# Patient Record
Sex: Male | Born: 1955 | Race: Black or African American | Hispanic: No | Marital: Single | Smoking: Current every day smoker
Health system: Southern US, Community
[De-identification: ages and names within clinical notes are randomized; demographics above are authoritative.]

## PROBLEM LIST (undated history)

## (undated) DIAGNOSIS — C801 Malignant (primary) neoplasm, unspecified: Secondary | ICD-10-CM

## (undated) DIAGNOSIS — Z5111 Encounter for antineoplastic chemotherapy: Secondary | ICD-10-CM

## (undated) DIAGNOSIS — C419 Malignant neoplasm of bone and articular cartilage, unspecified: Secondary | ICD-10-CM

## (undated) DIAGNOSIS — C3411 Malignant neoplasm of upper lobe, right bronchus or lung: Principal | ICD-10-CM

## (undated) HISTORY — DX: Encounter for antineoplastic chemotherapy: Z51.11

## (undated) HISTORY — DX: Malignant neoplasm of upper lobe, right bronchus or lung: C34.11

---

## 2014-03-18 HISTORY — PX: LAMINECTOMY: SHX219

## 2014-09-12 ENCOUNTER — Encounter: Payer: Self-pay | Admitting: Internal Medicine

## 2014-09-12 ENCOUNTER — Ambulatory Visit (HOSPITAL_BASED_OUTPATIENT_CLINIC_OR_DEPARTMENT_OTHER): Payer: Medicaid Other | Admitting: Lab

## 2014-09-12 ENCOUNTER — Encounter (INDEPENDENT_AMBULATORY_CARE_PROVIDER_SITE_OTHER): Payer: Self-pay

## 2014-09-12 ENCOUNTER — Telehealth: Payer: Self-pay | Admitting: Medical Oncology

## 2014-09-12 ENCOUNTER — Ambulatory Visit (HOSPITAL_BASED_OUTPATIENT_CLINIC_OR_DEPARTMENT_OTHER): Payer: Medicaid Other | Admitting: Internal Medicine

## 2014-09-12 ENCOUNTER — Telehealth: Payer: Self-pay | Admitting: Internal Medicine

## 2014-09-12 ENCOUNTER — Ambulatory Visit: Payer: Medicaid Other

## 2014-09-12 VITALS — BP 116/78 | HR 57 | Temp 97.5°F | Resp 18 | Ht >= 80 in | Wt 211.0 lb

## 2014-09-12 DIAGNOSIS — C3411 Malignant neoplasm of upper lobe, right bronchus or lung: Secondary | ICD-10-CM | POA: Insufficient documentation

## 2014-09-12 DIAGNOSIS — C3490 Malignant neoplasm of unspecified part of unspecified bronchus or lung: Secondary | ICD-10-CM

## 2014-09-12 HISTORY — DX: Malignant neoplasm of upper lobe, right bronchus or lung: C34.11

## 2014-09-12 LAB — CBC WITH DIFFERENTIAL/PLATELET
BASO%: 0.6 % (ref 0.0–2.0)
BASOS ABS: 0 10*3/uL (ref 0.0–0.1)
EOS%: 5.6 % (ref 0.0–7.0)
Eosinophils Absolute: 0.2 10*3/uL (ref 0.0–0.5)
HCT: 37.5 % — ABNORMAL LOW (ref 38.4–49.9)
HGB: 12 g/dL — ABNORMAL LOW (ref 13.0–17.1)
LYMPH#: 1.2 10*3/uL (ref 0.9–3.3)
LYMPH%: 35.6 % (ref 14.0–49.0)
MCH: 26.3 pg — ABNORMAL LOW (ref 27.2–33.4)
MCHC: 32 g/dL (ref 32.0–36.0)
MCV: 82.1 fL (ref 79.3–98.0)
MONO#: 0.4 10*3/uL (ref 0.1–0.9)
MONO%: 10.6 % (ref 0.0–14.0)
NEUT#: 1.6 10*3/uL (ref 1.5–6.5)
NEUT%: 47.6 % (ref 39.0–75.0)
Platelets: 252 10*3/uL (ref 140–400)
RBC: 4.57 10*6/uL (ref 4.20–5.82)
RDW: 19.1 % — AB (ref 11.0–14.6)
WBC: 3.4 10*3/uL — ABNORMAL LOW (ref 4.0–10.3)

## 2014-09-12 LAB — COMPREHENSIVE METABOLIC PANEL (CC13)
ALBUMIN: 3.6 g/dL (ref 3.5–5.0)
ALT: 17 U/L (ref 0–55)
AST: 19 U/L (ref 5–34)
Alkaline Phosphatase: 84 U/L (ref 40–150)
Anion Gap: 8 mEq/L (ref 3–11)
BUN: 11.6 mg/dL (ref 7.0–26.0)
CALCIUM: 9.9 mg/dL (ref 8.4–10.4)
CHLORIDE: 104 meq/L (ref 98–109)
CO2: 28 meq/L (ref 22–29)
Creatinine: 1 mg/dL (ref 0.7–1.3)
EGFR: >90 mL/min/{1.73_m2} (ref >90)
Glucose: 60 mg/dl — ABNORMAL LOW (ref 70–140)
POTASSIUM: 4.1 meq/L (ref 3.5–5.1)
Sodium: 140 mEq/L (ref 136–145)
Total Bilirubin: 0.27 mg/dL (ref 0.20–1.20)
Total Protein: 7.2 g/dL (ref 6.4–8.3)

## 2014-09-12 MED ORDER — MORPHINE SULFATE ER 30 MG PO TBCR: 30.0000 mg | EXTENDED_RELEASE_TABLET | Freq: Two times a day (BID) | ORAL | Status: DC

## 2014-09-12 NOTE — Patient Instructions (Signed)
Faxed records request to [place]

## 2014-09-12 NOTE — Telephone Encounter (Signed)
Requested records.

## 2014-09-12 NOTE — Progress Notes (Signed)
Checked in new pt with no financial concerns at this time.  Pt has my card for any billing or insurance questions or concerns.

## 2014-09-12 NOTE — Telephone Encounter (Signed)
S/w pt confirming MD visit per 12/23 POF, mailed sch to pt.... KJ

## 2014-09-12 NOTE — Progress Notes (Signed)
[facility] [phone]   [phone]  CONSULT NOTE  REFERRING PHYSICIAN: Dr. Iona Beard Osei-Bonsu  REASON FOR CONSULTATION:  58 years old African-American male with metastatic lung cancer  HPI Herbert Smith is a 58 y.o. male with past medical history significant for metastatic non-small cell lung cancer, cataract, glaucoma, COPD and degenerative disc disease as well as long history of smoking. The patient recently moved to [place] from [place]. The patient mentions that in 2011 he presented to the hospital in [place] complaining of shortness of breath and hemoptysis. His initial evaluation in March 2011 showed locally advanced non-small cell lung cancer, squamous cell carcinoma, stage IIIB presented with 3.5 cm right infrahilar mass with subcarinal adenopathy and no distant metastasis. The patient mentioned that she was in denial at the beginning and he missed several appointments. He presented again to his medical oncologist Dr. Bradly Chris and repeat CT scan of the chest on 12/09/2011 showed the right upper lobe lung mass was 6.5 x 7.2 x 10.0 cm and contiguous with subcarinal nodes. There was also a 7 mm pleural-based nodule at the left fissure. He was started on induction chemotherapy with carboplatin and paclitaxel followed by a course of concurrent chemoradiation with carboplatin and paclitaxel completed in July 2013 with stable disease in the chest. In early 2015 the patient presented with progressive back and flank pain. Restaging scan of the chest, abdomen and pelvis on 01/03/2014 showed no evidence for disease progression in the lung but there was increased in T12 is sclerosis. An MRI of the thoracic spine on 02/13/2014 showed T12 tumor was posterior epidural extension. The patient was seen by neurosurgery, Dr. Loreli Slot. He underwent decompressive T11-L1 laminectomies, right T11-12 nerve root division and T11-L1 posterior spinal fusion on  03/18/2014. 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 The tissue block percent for molecular studies and was negative for EGFR mutation as well as ALK gene translocation He also underwent CyberKnife radiosurgery to the vertebral body tumor in early August 2015. He was supposed to have repeat CT scan of the chest, abdomen and pelvis in early 2016 but the patient moved to [place] to be close to his family. In [place] was at the [employer]. His pain management was in the form of MS Contin 60 mg by mouth twice a day in addition to oxycodone 5 mg 1-2 tablets every 4 hours as needed as well as Roxanol 10 mg every 3 hours as needed for pain. He was also on Neurontin 300 mg 2 tablets 3 times a day. His pain management was provided by his primary care physician in [place]. The patient recently established care with Dr. Vista Lawman and he was referred to me today for evaluation and recommendation regarding his metastatic lung cancer. When seen today he continues to have pain in the lower back. His primary care physician provided him with a small amount of the short-acting pain medicine. The patient also has constipation and he is currently on medical ask and Dulcolax. He denied having any significant chest pain, shortness breath, cough or hemoptysis. He denied having any nausea or vomiting, no fever or chills. He denied having any significant weight loss or night sweats.  HPI  PAST MEDICAL HISTORY: Significant for metastatic non-small cell lung cancer, COPD, cataracts and glaucoma as well as degenerative disc disease   FAMILY HISTORY: Mother deceased when he was young, father had glaucoma and sister  had lung cancer at age 50   SOCIAL HISTORY: The patient is single and has one son. He used to work as a Horticulturist, commercial. He has a history of smoking 1 pack per day for around 42 years and unfortunately continues to  smoke. I strongly encouraged him to quit smoking and offered him a smoke cessation program. He has no current history of alcohol but has a remote history of drug abuse.  Social History History  Substance Use Topics  . Smoking status: Current Every Day Smoker -- 1.50 packs/day for 15 years    Types: Cigarettes  . Smokeless tobacco: Never Used  . Alcohol Use: No    Allergies  Allergen Reactions  . Fentanyl Itching    Current Outpatient Prescriptions  Medication Sig Dispense Refill  . albuterol (PROVENTIL HFA;VENTOLIN HFA) 108 (90 BASE) MCG/ACT inhaler Inhale 2 puffs into the lungs every 6 (six) hours as needed for wheezing or shortness of breath.    . cyclobenzaprine (FLEXERIL) 10 MG tablet Take 10 mg by mouth.  3  . gabapentin (NEURONTIN) 300 MG capsule Take 300 mg by mouth 3 (three) times daily.  3  . ibuprofen (ADVIL,MOTRIN) 200 MG tablet Take 200 mg by mouth every 6 (six) hours as needed.    Marland Kitchen oxyCODONE (OXY IR/ROXICODONE) 5 MG immediate release tablet Take 5 mg by mouth.  0  . senna (SENOKOT) 8.6 MG tablet Take 1 tablet by mouth 2 (two) times daily.    Marland Kitchen tiotropium (SPIRIVA) 18 MCG inhalation capsule Place 18 mcg into inhaler and inhale 2 (two) times daily.    Marland Kitchen morphine (MS CONTIN) 30 MG 12 hr tablet Take 1 tablet (30 mg total) by mouth every 12 (twelve) hours. 60 tablet 0  . pregabalin (LYRICA) 100 MG capsule Take 100 mg by mouth 2 (two) times daily.     No current facility-administered medications for this visit.    Review of Systems  Constitutional: positive for anorexia and fatigue Eyes: negative Ears, nose, mouth, throat, and face: negative Respiratory: positive for dyspnea on exertion Cardiovascular: negative Gastrointestinal: negative Genitourinary:negative Integument/breast: negative Hematologic/lymphatic: negative Musculoskeletal:positive for back pain Neurological: negative Behavioral/Psych: negative Endocrine: negative Allergic/Immunologic:  negative  Physical Exam  RXV:QMGQQ, healthy, no distress, well nourished and well developed SKIN: skin color, texture, turgor are normal, no rashes or significant lesions HEAD: Normocephalic, No masses, lesions, tenderness or abnormalities EYES: normal, PERRLA EARS: External ears normal, Canals clear OROPHARYNX:no exudate, no erythema and lips, buccal mucosa, and tongue normal  NECK: supple, no adenopathy, no JVD LYMPH:  no palpable lymphadenopathy, no hepatosplenomegaly LUNGS: clear to auscultation , and palpation HEART: regular rate & rhythm, no murmurs and no gallops ABDOMEN:abdomen soft, non-tender, normal bowel sounds and no masses or organomegaly BACK: Tenderness at the lower back with radiation to the iliac crests bilaterally. EXTREMITIES:no joint deformities, effusion, or inflammation, no edema, no skin discoloration, no clubbing  NEURO: alert & oriented x 3 with fluent speech, no focal motor/sensory deficits  PERFORMANCE STATUS: ECOG 1  LABORATORY DATA: Lab Results  Component Value Date   WBC 3.4* 09/12/2014   HGB 12.0* 09/12/2014   HCT 37.5* 09/12/2014   MCV 82.1 09/12/2014   PLT 252 09/12/2014      Chemistry      Component Value Date/Time   NA 140 09/12/2014 1450   K 4.1 09/12/2014 1450   CO2 28 09/12/2014 1450   BUN 11.6 09/12/2014 1450   CREATININE 1.0 09/12/2014 1450      Component  Value Date/Time   CALCIUM 9.9 09/12/2014 1450   ALKPHOS 84 09/12/2014 1450   AST 19 09/12/2014 1450   ALT 17 09/12/2014 1450   BILITOT 0.27 09/12/2014 1450       RADIOGRAPHIC STUDIES: No results found.  ASSESSMENT: This is a very pleasant 58 years old African-American male with metastatic non-small cell lung cancer initially diagnosed as a stage IIIB (T4, N2, M0) non-small cell lung cancer, squamous cell carcinoma but the resected tumor from the T12 lesion was consistent with poorly differentiated adenocarcinoma with squamatoid futures with negative EGFR mutation and  negative ALK gene translocation. The patient is status post induction systemic chemotherapy was carboplatin and paclitaxel followed by a course of concurrent chemoradiation as well as T12 laminectomy for the metastatic bone lesion and epidural extension followed by stereotactic radiotherapy to the bone lesion.    PLAN: I had a lengthy discussion with the patient today and I spent a lengthy time reviewing his previous record from [facility]. I discussed with the patient his treatment options including palliative care and hospice referral versus consideration of systemic chemotherapy if he has any further evidence for disease progression. The patient is interested in treatment if needed. I will arrange for the patient to have repeat CT scan of the head, chest, abdomen and pelvis next week. I will see him back for follow-up visit in 2 weeks for reevaluation and discussion of his treatment options based on the imaging studies. For pain management, I gave the patient refills of MS Contin 30 mg by mouth every 12 hours. He will continue on has current breakthrough pain as prescribed by his primary care physician. For smoke cessation, I strongly encouraged the patient to quit smoking and offered him a smoke cessation program. The patient was advised to call immediately if he has any concerning symptoms in the interval. The patient voices understanding of current disease status and treatment options and is in agreement with the current care plan.  All questions were answered. The patient knows to call the clinic with any problems, questions or concerns. We can certainly see the patient much sooner if necessary.  Thank you so much for allowing me to participate in the care of Herbert Smith. I will continue to follow up the patient with you and assist in his care.  I spent 50 minutes counseling the patient face to face. The total time spent in the appointment was 80 minutes.  Disclaimer: This note was  dictated with voice recognition software. Similar sounding words can inadvertently be transcribed and may not be corrected upon review.   Herbert Smith K. 09/12/2014, 6:41 PM

## 2014-09-20 ENCOUNTER — Ambulatory Visit (HOSPITAL_COMMUNITY)
Admission: RE | Admit: 2014-09-20 | Discharge: 2014-09-20 | Disposition: A | Discharge status: Home / Self Care | Payer: Medicaid Other | Source: Ambulatory Visit | Attending: Internal Medicine | Admitting: Internal Medicine

## 2014-09-20 DIAGNOSIS — C3490 Malignant neoplasm of unspecified part of unspecified bronchus or lung: Secondary | ICD-10-CM | POA: Insufficient documentation

## 2014-09-20 MED ORDER — IOHEXOL 300 MG/ML  SOLN
50.0000 mL | Freq: Once | INTRAMUSCULAR | Status: AC | PRN
  Administered 2014-09-20: 50 mL via ORAL

## 2014-09-20 MED ORDER — IOHEXOL 300 MG/ML  SOLN
100.0000 mL | Freq: Once | INTRAMUSCULAR | Status: AC | PRN
  Administered 2014-09-20: 100 mL via INTRAVENOUS

## 2014-09-26 ENCOUNTER — Encounter: Payer: Self-pay | Admitting: Internal Medicine

## 2014-09-26 ENCOUNTER — Telehealth: Payer: Self-pay | Admitting: Internal Medicine

## 2014-09-26 ENCOUNTER — Ambulatory Visit (HOSPITAL_BASED_OUTPATIENT_CLINIC_OR_DEPARTMENT_OTHER): Payer: Self-pay | Admitting: Internal Medicine

## 2014-09-26 VITALS — BP 122/60 | HR 55 | Temp 97.3°F | Resp 18 | Ht >= 80 in | Wt 211.0 lb

## 2014-09-26 DIAGNOSIS — C3490 Malignant neoplasm of unspecified part of unspecified bronchus or lung: Secondary | ICD-10-CM

## 2014-09-26 DIAGNOSIS — C7951 Secondary malignant neoplasm of bone: Secondary | ICD-10-CM

## 2014-09-26 DIAGNOSIS — M549 Dorsalgia, unspecified: Secondary | ICD-10-CM | POA: Insufficient documentation

## 2014-09-26 DIAGNOSIS — M546 Pain in thoracic spine: Secondary | ICD-10-CM

## 2014-09-26 MED ORDER — OXYCODONE HCL 5 MG PO TABS: 5.0000 mg | ORAL_TABLET | Freq: Four times a day (QID) | ORAL | Status: DC | PRN

## 2014-09-26 NOTE — Patient Instructions (Signed)
Smoking Cessation, Tips for Success  If you are ready to quit smoking, congratulations! You have chosen to help yourself be healthier. Cigarettes bring nicotine, tar, carbon monoxide, and other irritants into your body. Your lungs, heart, and blood vessels will be able to work better without these poisons. There are many different ways to quit smoking. Nicotine gum, nicotine patches, a nicotine inhaler, or nicotine nasal spray can help with physical craving. Hypnosis, support groups, and medicines help break the habit of smoking.  WHAT THINGS CAN I DO TO MAKE QUITTING EASIER?   Here are some tips to help you quit for good:  · Pick a date when you will quit smoking completely. Tell all of your friends and family about your plan to quit on that date.  · Do not try to slowly cut down on the number of cigarettes you are smoking. Pick a quit date and quit smoking completely starting on that day.  · Throw away all cigarettes.    · Clean and remove all ashtrays from your home, work, and car.  · On a card, write down your reasons for quitting. Carry the card with you and read it when you get the urge to smoke.  · Cleanse your body of nicotine. Drink enough water and fluids to keep your urine clear or pale yellow. Do this after quitting to flush the nicotine from your body.  · Learn to predict your moods. Do not let a bad situation be your excuse to have a cigarette. Some situations in your life might tempt you into wanting a cigarette.  · Never have "just one" cigarette. It leads to wanting another and another. Remind yourself of your decision to quit.  · Change habits associated with smoking. If you smoked while driving or when feeling stressed, try other activities to replace smoking. Stand up when drinking your coffee. Brush your teeth after eating. Sit in a different chair when you read the paper. Avoid alcohol while trying to quit, and try to drink fewer caffeinated beverages. Alcohol and caffeine may urge you to  smoke.  · Avoid foods and drinks that can trigger a desire to smoke, such as sugary or spicy foods and alcohol.  · Ask people who smoke not to smoke around you.  · Have something planned to do right after eating or having a cup of coffee. For example, plan to take a walk or exercise.  · Try a relaxation exercise to calm you down and decrease your stress. Remember, you may be tense and nervous for the first 2 weeks after you quit, but this will pass.  · Find new activities to keep your hands busy. Play with a pen, coin, or rubber band. Doodle or draw things on paper.  · Brush your teeth right after eating. This will help cut down on the craving for the taste of tobacco after meals. You can also try mouthwash.    · Use oral substitutes in place of cigarettes. Try using lemon drops, carrots, cinnamon sticks, or chewing gum. Keep them handy so they are available when you have the urge to smoke.  · When you have the urge to smoke, try deep breathing.  · Designate your home as a nonsmoking area.  · If you are a heavy smoker, ask your health care provider about a prescription for nicotine chewing gum. It can ease your withdrawal from nicotine.  · Reward yourself. Set aside the cigarette money you save and buy yourself something nice.  · Look for   support from others. Join a support group or smoking cessation program. Ask someone at home or at work to help you with your plan to quit smoking.  · Always ask yourself, "Do I need this cigarette or is this just a reflex?" Tell yourself, "Today, I choose not to smoke," or "I do not want to smoke." You are reminding yourself of your decision to quit.  · Do not replace cigarette smoking with electronic cigarettes (commonly called e-cigarettes). The safety of e-cigarettes is unknown, and some may contain harmful chemicals.  · If you relapse, do not give up! Plan ahead and think about what you will do the next time you get the urge to smoke.  HOW WILL I FEEL WHEN I QUIT SMOKING?  You  may have symptoms of withdrawal because your body is used to nicotine (the addictive substance in cigarettes). You may crave cigarettes, be irritable, feel very hungry, cough often, get headaches, or have difficulty concentrating. The withdrawal symptoms are only temporary. They are strongest when you first quit but will go away within 10-14 days. When withdrawal symptoms occur, stay in control. Think about your reasons for quitting. Remind yourself that these are signs that your body is healing and getting used to being without cigarettes. Remember that withdrawal symptoms are easier to treat than the major diseases that smoking can cause.   Even after the withdrawal is over, expect periodic urges to smoke. However, these cravings are generally short lived and will go away whether you smoke or not. Do not smoke!  WHAT RESOURCES ARE AVAILABLE TO HELP ME QUIT SMOKING?  Your health care provider can direct you to community resources or hospitals for support, which may include:  · Group support.  · Education.  · Hypnosis.  · Therapy.  Document Released: 06/05/2004 Document Revised: 01/22/2014 Document Reviewed: 02/23/2013  [employer]® Patient Information ©2015 [employer]. This information is not intended to replace advice given to you by your health care provider. Make sure you discuss any questions you have with your health care provider.

## 2014-09-26 NOTE — Telephone Encounter (Signed)
gv and printed appt sched and avs for pt for Feb 2016

## 2014-09-26 NOTE — Progress Notes (Signed)
[place] [phone]   [phone]  OFFICE PROGRESS NOTE  Benito Mccreedy, MD [address] [place] [address]  DIAGNOSIS: Metastatic non-small cell lung cancer, squamous cell carcinoma initially diagnosed as a stage IIIB in March 2011 in [place] THERAPY: 1) status post induction chemotherapy with carboplatin and paclitaxel followed by a course of concurrent chemoradiation with carboplatin and paclitaxel completed in July 2013 with stable disease in the chest. This was under the care of Dr. Bradly Chris in [place]. 2) status post decompressive T11-L1 laminectomies, right T11-12 nerve root division and T11-L1 posterior spinal fusion on 03/18/2014. 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 The tumor was negative for EGFR mutation and negative for ALK gene translocation. 3) status post CyberKnife radiosurgery to the vertebral body tumor in early August 2015.  CURRENT THERAPY: Observation.  INTERVAL HISTORY: Herbert Smith 59 y.o. male returns to the clinic today for follow-up visit. I also the patient 2 weeks ago for initial evaluation after he moved to [place] from [place]. He was followed by palliative care in [place]. He continues to have back pain and he is currently on MS Contin 60 mg by mouth twice a day in addition to oxycodone 5 mg by mouth daily for our as needed for pain. He is also on Neurontin 600 mg by mouth 3 times a day. The patient is interested in proceeding with further therapy if needed. He denied having any other significant complaints today. He has right-sided chest pain but no significant shortness of breath, cough or hemoptysis. He denied having any significant weight loss or night sweats. The patient has no nausea or vomiting, no fever or chills. He had repeat CT scan of the chest, abdomen and pelvis  performed recently and he is here for evaluation and discussion of his scan results.  MEDICAL HISTORY:History reviewed. No pertinent past medical history.  ALLERGIES:  is allergic to fentanyl.  MEDICATIONS:  Current Outpatient Prescriptions  Medication Sig Dispense Refill  . albuterol (PROVENTIL HFA;VENTOLIN HFA) 108 (90 BASE) MCG/ACT inhaler Inhale 2 puffs into the lungs every 6 (six) hours as needed for wheezing or shortness of breath.    . cyclobenzaprine (FLEXERIL) 10 MG tablet Take 10 mg by mouth.  3  . gabapentin (NEURONTIN) 300 MG capsule Take 300 mg by mouth 3 (three) times daily.  3  . ibuprofen (ADVIL,MOTRIN) 200 MG tablet Take 200 mg by mouth every 6 (six) hours as needed.    Marland Kitchen morphine (MS CONTIN) 30 MG 12 hr tablet Take 1 tablet (30 mg total) by mouth every 12 (twelve) hours. 60 tablet 0  . oxyCODONE (OXY IR/ROXICODONE) 5 MG immediate release tablet Take 5 mg by mouth.  0  . pregabalin (LYRICA) 100 MG capsule Take 100 mg by mouth 2 (two) times daily.    Marland Kitchen senna (SENOKOT) 8.6 MG tablet Take 1 tablet by mouth 2 (two) times daily.    Marland Kitchen tiotropium (SPIRIVA) 18 MCG inhalation capsule Place 18 mcg into inhaler and inhale 2 (two) times daily.     No current facility-administered medications for this visit.    SURGICAL HISTORY: History reviewed. No pertinent past surgical history.  REVIEW OF SYSTEMS:  Constitutional: positive for fatigue Eyes: negative Ears, nose, mouth, throat, and face: negative Respiratory: positive for pleurisy/chest pain Cardiovascular: negative Gastrointestinal: negative Genitourinary:negative Integument/breast: negative Hematologic/lymphatic: negative Musculoskeletal:positive for back pain Neurological: negative Behavioral/Psych:  negative Endocrine: negative Allergic/Immunologic: negative   PHYSICAL EXAMINATION: General appearance: alert, cooperative, fatigued and no distress Head: Normocephalic, without obvious abnormality, atraumatic Neck: no  adenopathy, no JVD, supple, symmetrical, trachea midline and thyroid not enlarged, symmetric, no tenderness/mass/nodules Lymph nodes: Cervical, supraclavicular, and axillary nodes normal. Resp: clear to auscultation bilaterally Back: symmetric, no curvature. ROM normal. No CVA tenderness. Cardio: regular rate and rhythm, S1, S2 normal, no murmur, click, rub or gallop GI: soft, non-tender; bowel sounds normal; no masses,  no organomegaly Extremities: extremities normal, atraumatic, no cyanosis or edema Neurologic: Alert and oriented X 3, normal strength and tone. Normal symmetric reflexes. Normal coordination and gait  ECOG PERFORMANCE STATUS: 1 - Symptomatic but completely ambulatory  Blood pressure 122/60, pulse 55, temperature 97.3 F (36.3 C), temperature source Oral, resp. rate 18, height $RemoveBe'6\' 8"'WJOKsrclF$  (2.032 m), weight 211 lb (95.709 kg).  LABORATORY DATA: Lab Results  Component Value Date   WBC 3.4* 09/12/2014   HGB 12.0* 09/12/2014   HCT 37.5* 09/12/2014   MCV 82.1 09/12/2014   PLT 252 09/12/2014      Chemistry      Component Value Date/Time   NA 140 09/12/2014 1450   K 4.1 09/12/2014 1450   CO2 28 09/12/2014 1450   BUN 11.6 09/12/2014 1450   CREATININE 1.0 09/12/2014 1450      Component Value Date/Time   CALCIUM 9.9 09/12/2014 1450   ALKPHOS 84 09/12/2014 1450   AST 19 09/12/2014 1450   ALT 17 09/12/2014 1450   BILITOT 0.27 09/12/2014 1450       RADIOGRAPHIC STUDIES: Ct Chest W Contrast  09/20/2014   CLINICAL DATA:  Patient with history of metastatic lung cancer.  EXAM: CT CHEST, ABDOMEN, AND PELVIS WITH CONTRAST  TECHNIQUE: Multidetector CT imaging of the chest, abdomen and pelvis was performed following the standard protocol during bolus administration of intravenous contrast.  CONTRAST:  72mL OMNIPAQUE IOHEXOL 300 MG/ML SOLN, 172mL OMNIPAQUE IOHEXOL 300 MG/ML SOLN  COMPARISON:  None.  FINDINGS: CT CHEST FINDINGS  Visualized thyroid is unremarkable. Bilateral  gynecomastia. Normal heart size. No pericardial effusion. Aorta and main pulmonary artery are normal in caliber. There is a 2.9 x 1.7 cm right periaortic soft tissue mass at the diaphragmatic hiatus (image 63; series 2). Within the right upper hemi thorax there is a 6.2 x 5.7 cm cystic mass (image 27; series 2).  Central airways are patent. Extensive bullous and emphysematous changes demonstrated throughout the lungs. There is irregular right hilar soft tissue including a 6.6 x 3.1 cm masslike area of soft tissue within the posterior medial right hemi thorax (image 33; series 2). There is associated consolidation and volume loss within the paramediastinal right lung. Overall findings are likely sequelae of known tumor and and post radiation fibrosis. Small right pleural effusion.  There is a 5 mm left lower lobe pulmonary nodule (image 68; series 2). There is a 6 mm subpleural left upper lobe nodule (image 49; series 2). There is a 5 mm subpleural left lower lobe nodule (image 49; series 5). There is a 7 mm subpleural left lower lobe nodule (image 46; series 5). There is a 4 mm right middle lobe pulmonary nodule (image 39; series 5).  CT ABDOMEN AND PELVIS FINDINGS  Hepatobiliary: Liver is normal in size and contour without focal hepatic lesion identified. Gallbladder is unremarkable. No intrahepatic or extrahepatic biliary ductal dilatation.  Pancreas: Unremarkable  Spleen: Unremarkable  Adrenals/Urinary Tract: Normal adrenal glands. There is a 3 cm cyst  within the interpolar region of the right kidney. Bilateral nonobstructing nephrolithiasis with the largest stone in the left kidney measuring 6 mm. Urinary bladder is unremarkable.  Stomach/Bowel: There is suggestion of wall thickening at the level of the gastric antrum (image 77; series 2). No evidence for small bowel obstruction. Stool throughout the colon.  Vascular/Lymphatic: Normal caliber abdominal aorta. No retroperitoneal lymphadenopathy.  Other: None   Musculoskeletal: Mixed lytic and sclerotic metastasis involving the T12 vertebral body. There is posterior fusion hardware spanning the T11, T12 and L1 vertebral bodies.  IMPRESSION: No prior outside examinations are available at this time for comparison. If these become available, an addendum can be issued.  Irregular soft tissue and associated soft tissue mass within the right hilum and medial right upper hemi thorax likely represents a combination of known pulmonary malignancy and masslike radiation fibrosis.  Right periaortic soft tissue mass at the level of diaphragmatic hiatus, concerning for metastatic disease.  Nonspecific cystic mass within the medial right upper hemi thorax, potentially representing a fluid-filled bulla. Necrotic tumor is felt to be less likely. Recommend comparison with outside priors.  Multiple additional pulmonary nodules, metastatic disease is not excluded.  Mixed lytic and sclerotic metastasis involving the T12 vertebral body with associated posterior fusion hardware from T11-L1.  Bilateral nephrolithiasis.  Nonspecific thickening of the gastric antrum. Recommend correlation for signs of gastritis. Attention on followup is recommended as malignancy/mass is not entirely excluded.   Electronically Signed   By: Lovey Newcomer M.D.   On: 09/20/2014 16:34   Ct Abdomen Pelvis W Contrast  09/20/2014   CLINICAL DATA:  Patient with history of metastatic lung cancer.  EXAM: CT CHEST, ABDOMEN, AND PELVIS WITH CONTRAST  TECHNIQUE: Multidetector CT imaging of the chest, abdomen and pelvis was performed following the standard protocol during bolus administration of intravenous contrast.  CONTRAST:  69mL OMNIPAQUE IOHEXOL 300 MG/ML SOLN, 173mL OMNIPAQUE IOHEXOL 300 MG/ML SOLN  COMPARISON:  None.  FINDINGS: CT CHEST FINDINGS  Visualized thyroid is unremarkable. Bilateral gynecomastia. Normal heart size. No pericardial effusion. Aorta and main pulmonary artery are normal in caliber. There is a 2.9 x  1.7 cm right periaortic soft tissue mass at the diaphragmatic hiatus (image 63; series 2). Within the right upper hemi thorax there is a 6.2 x 5.7 cm cystic mass (image 27; series 2).  Central airways are patent. Extensive bullous and emphysematous changes demonstrated throughout the lungs. There is irregular right hilar soft tissue including a 6.6 x 3.1 cm masslike area of soft tissue within the posterior medial right hemi thorax (image 33; series 2). There is associated consolidation and volume loss within the paramediastinal right lung. Overall findings are likely sequelae of known tumor and and post radiation fibrosis. Small right pleural effusion.  There is a 5 mm left lower lobe pulmonary nodule (image 68; series 2). There is a 6 mm subpleural left upper lobe nodule (image 49; series 2). There is a 5 mm subpleural left lower lobe nodule (image 49; series 5). There is a 7 mm subpleural left lower lobe nodule (image 46; series 5). There is a 4 mm right middle lobe pulmonary nodule (image 39; series 5).  CT ABDOMEN AND PELVIS FINDINGS  Hepatobiliary: Liver is normal in size and contour without focal hepatic lesion identified. Gallbladder is unremarkable. No intrahepatic or extrahepatic biliary ductal dilatation.  Pancreas: Unremarkable  Spleen: Unremarkable  Adrenals/Urinary Tract: Normal adrenal glands. There is a 3 cm cyst within the interpolar region of the right kidney.  Bilateral nonobstructing nephrolithiasis with the largest stone in the left kidney measuring 6 mm. Urinary bladder is unremarkable.  Stomach/Bowel: There is suggestion of wall thickening at the level of the gastric antrum (image 77; series 2). No evidence for small bowel obstruction. Stool throughout the colon.  Vascular/Lymphatic: Normal caliber abdominal aorta. No retroperitoneal lymphadenopathy.  Other: None  Musculoskeletal: Mixed lytic and sclerotic metastasis involving the T12 vertebral body. There is posterior fusion hardware spanning  the T11, T12 and L1 vertebral bodies.  IMPRESSION: No prior outside examinations are available at this time for comparison. If these become available, an addendum can be issued.  Irregular soft tissue and associated soft tissue mass within the right hilum and medial right upper hemi thorax likely represents a combination of known pulmonary malignancy and masslike radiation fibrosis.  Right periaortic soft tissue mass at the level of diaphragmatic hiatus, concerning for metastatic disease.  Nonspecific cystic mass within the medial right upper hemi thorax, potentially representing a fluid-filled bulla. Necrotic tumor is felt to be less likely. Recommend comparison with outside priors.  Multiple additional pulmonary nodules, metastatic disease is not excluded.  Mixed lytic and sclerotic metastasis involving the T12 vertebral body with associated posterior fusion hardware from T11-L1.  Bilateral nephrolithiasis.  Nonspecific thickening of the gastric antrum. Recommend correlation for signs of gastritis. Attention on followup is recommended as malignancy/mass is not entirely excluded.   Electronically Signed   By: Lovey Newcomer M.D.   On: 09/20/2014 16:34    ASSESSMENT AND PLAN: This is a very pleasant 59 years old African-American male with metastatic non-small cell lung cancer initially diagnosed as a stage IIIB squamous cell carcinoma but the patient has metastatic disease at the T12 status post decompressive laminectomy and the final pathology was consistent with adenocarcinoma with negative EGFR mutation and negative ALK gene translocation. The recent CT scan of the chest, abdomen and pelvis showed some concerning finding including irregular soft tissue mass within the right hilum and medial right upper hemithorax concerning for metastatic disease and masslike radiation fibrosis. There was also right periaortic soft tissue mass at the level of the diaphragmatic hiatus concerning for metastatic disease and  multiple additional pulmonary nodules. Unfortunately there is no prior CT imaging for comparison and it is unknown of these lesions are new or has been there before. I recommended for the patient to try to get a copy of the last CT images from his previous oncologist for comparison. I will see him back for follow-up visit in one month's for reevaluation. The patient is interested in considering systemic therapy if needed for any disease progression. This will be discussed with him in more details on his upcoming visit. For pain management she will continue with the current pain medication with MS Contin and Percocet. He was given a refill of Percocet today. He was advised to call immediately if he has any concerning symptoms in the interval. The patient voices understanding of current disease status and treatment options and is in agreement with the current care plan.  All questions were answered. The patient knows to call the clinic with any problems, questions or concerns. We can certainly see the patient much sooner if necessary.  I spent 15 minutes counseling the patient face to face. The total time spent in the appointment was 25 minutes.  Disclaimer: This note was dictated with voice recognition software. Similar sounding words can inadvertently be transcribed and may not be corrected upon review.

## 2014-10-24 ENCOUNTER — Encounter: Payer: Self-pay | Admitting: Physician Assistant

## 2014-10-24 ENCOUNTER — Ambulatory Visit (HOSPITAL_BASED_OUTPATIENT_CLINIC_OR_DEPARTMENT_OTHER): Payer: Medicaid Other | Admitting: Physician Assistant

## 2014-10-24 ENCOUNTER — Other Ambulatory Visit (HOSPITAL_BASED_OUTPATIENT_CLINIC_OR_DEPARTMENT_OTHER): Payer: Medicaid Other

## 2014-10-24 ENCOUNTER — Telehealth: Payer: Self-pay | Admitting: Internal Medicine

## 2014-10-24 VITALS — BP 116/77 | HR 64 | Temp 98.0°F | Resp 18 | Ht >= 80 in | Wt 212.4 lb

## 2014-10-24 DIAGNOSIS — C7951 Secondary malignant neoplasm of bone: Secondary | ICD-10-CM

## 2014-10-24 DIAGNOSIS — R911 Solitary pulmonary nodule: Secondary | ICD-10-CM

## 2014-10-24 DIAGNOSIS — C3490 Malignant neoplasm of unspecified part of unspecified bronchus or lung: Secondary | ICD-10-CM

## 2014-10-24 LAB — COMPREHENSIVE METABOLIC PANEL (CC13)
ALT: 18 U/L (ref 0–55)
AST: 29 U/L (ref 5–34)
Albumin: 3.7 g/dL (ref 3.5–5.0)
Alkaline Phosphatase: 86 U/L (ref 40–150)
Anion Gap: 7 mEq/L (ref 3–11)
BILIRUBIN TOTAL: 0.25 mg/dL (ref 0.20–1.20)
BUN: 14.1 mg/dL (ref 7.0–26.0)
CO2: 27 meq/L (ref 22–29)
Calcium: 9.6 mg/dL (ref 8.4–10.4)
Chloride: 106 mEq/L (ref 98–109)
Creatinine: 1 mg/dL (ref 0.7–1.3)
EGFR: >90 mL/min/{1.73_m2} (ref >90)
Glucose: 86 mg/dl (ref 70–140)
Potassium: 4.1 mEq/L (ref 3.5–5.1)
Sodium: 140 mEq/L (ref 136–145)
TOTAL PROTEIN: 7.5 g/dL (ref 6.4–8.3)

## 2014-10-24 LAB — CBC WITH DIFFERENTIAL/PLATELET
BASO%: 0.6 % (ref 0.0–2.0)
Basophils Absolute: 0 10*3/uL (ref 0.0–0.1)
EOS%: 4.4 % (ref 0.0–7.0)
Eosinophils Absolute: 0.2 10*3/uL (ref 0.0–0.5)
HEMATOCRIT: 39.9 % (ref 38.4–49.9)
HGB: 12.3 g/dL — ABNORMAL LOW (ref 13.0–17.1)
LYMPH%: 37.6 % (ref 14.0–49.0)
MCH: 25.2 pg — ABNORMAL LOW (ref 27.2–33.4)
MCHC: 30.8 g/dL — AB (ref 32.0–36.0)
MCV: 81.7 fL (ref 79.3–98.0)
MONO#: 0.5 10*3/uL (ref 0.1–0.9)
MONO%: 10.9 % (ref 0.0–14.0)
NEUT#: 2 10*3/uL (ref 1.5–6.5)
NEUT%: 46.5 % (ref 39.0–75.0)
Platelets: 284 10*3/uL (ref 140–400)
RBC: 4.88 10*6/uL (ref 4.20–5.82)
RDW: 18 % — AB (ref 11.0–14.6)
WBC: 4.3 10*3/uL (ref 4.0–10.3)
lymph#: 1.6 10*3/uL (ref 0.9–3.3)

## 2014-10-24 NOTE — Patient Instructions (Signed)
Follow up in 2 months with a restaging CT scan of your chest, abdomen and pelvis to re-evaluate your disease

## 2014-10-24 NOTE — Telephone Encounter (Signed)
gv and printed appt sched and avs for pt for April....gv pt barium

## 2014-10-24 NOTE — Progress Notes (Addendum)
[place] Telephone:(336) [phone]   Fax:(336) [phone]  OFFICE PROGRESS NOTE  Benito Mccreedy, MD [address] [place] [address]  DIAGNOSIS: Metastatic non-small cell lung cancer, squamous cell carcinoma initially diagnosed as a stage IIIB in March 2011 in [place], [place] THERAPY: 1) status post induction chemotherapy with carboplatin and paclitaxel followed by a course of concurrent chemoradiation with carboplatin and paclitaxel completed in July 2013 with stable disease in the chest. This was under the care of Dr. Bradly Chris in [place]. 2) status post decompressive T11-L1 laminectomies, right T11-12 nerve root division and T11-L1 posterior spinal fusion on 03/18/2014. 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 The tumor was negative for EGFR mutation and negative for ALK gene translocation. 3) status post CyberKnife radiosurgery to the vertebral body tumor in early August 2015.  CURRENT THERAPY: Observation.  INTERVAL HISTORY: Herbert Smith 59 y.o. male returns to the clinic today for follow-up visit. Patient states that he was unable to obtain a CD with his CT scans on from his previous institution in [place]. He presents today for further evaluation. He continues to have back pain from his previous surgery. He is currently on MS Contin 60 mg by mouth twice daily as well as Percocet 5/325 mg. He states that the Percocet causes constipation. He is currently using over-the-counter preparations to help with constipation with reasonable results. He voiced no other specific complaints today. I He has right-sided chest pain but no significant shortness of breath, cough or hemoptysis. He denied having any significant weight loss or night sweats. The patient has no nausea or vomiting, no fever or chills.   MEDICAL HISTORY:History reviewed. No pertinent past  medical history.  ALLERGIES:  is allergic to fentanyl.  MEDICATIONS:  Current Outpatient Prescriptions  Medication Sig Dispense Refill  . albuterol (PROVENTIL HFA;VENTOLIN HFA) 108 (90 BASE) MCG/ACT inhaler Inhale 2 puffs into the lungs every 6 (six) hours as needed for wheezing or shortness of breath.    . cyclobenzaprine (FLEXERIL) 10 MG tablet Take 10 mg by mouth.  3  . gabapentin (NEURONTIN) 300 MG capsule Take 300 mg by mouth 3 (three) times daily.  3  . ibuprofen (ADVIL,MOTRIN) 200 MG tablet Take 200 mg by mouth every 6 (six) hours as needed.    Marland Kitchen morphine (MS CONTIN) 30 MG 12 hr tablet Take 1 tablet (30 mg total) by mouth every 12 (twelve) hours. 60 tablet 0  . oxyCODONE (OXY IR/ROXICODONE) 5 MG immediate release tablet Take 1 tablet (5 mg total) by mouth every 6 (six) hours as needed for severe pain. 60 tablet 0  . pregabalin (LYRICA) 100 MG capsule Take 100 mg by mouth 2 (two) times daily.    Marland Kitchen senna (SENOKOT) 8.6 MG tablet Take 1 tablet by mouth 2 (two) times daily.    Marland Kitchen tiotropium (SPIRIVA) 18 MCG inhalation capsule Place 18 mcg into inhaler and inhale 2 (two) times daily.     No current facility-administered medications for this visit.    SURGICAL HISTORY: History reviewed. No pertinent past surgical history.  REVIEW OF SYSTEMS:  Constitutional: positive for fatigue Eyes: negative Ears, nose, mouth, throat, and face: negative Respiratory: positive for pleurisy/chest pain Cardiovascular: negative Gastrointestinal: positive for constipation Genitourinary:negative Integument/breast: negative Hematologic/lymphatic: negative Musculoskeletal:positive for back pain Neurological: negative Behavioral/Psych: negative Endocrine: negative Allergic/Immunologic: negative   PHYSICAL EXAMINATION: General appearance: alert, cooperative, fatigued and no distress Head: Normocephalic,  without obvious abnormality, atraumatic Neck: no adenopathy, no JVD, supple, symmetrical, trachea  midline and thyroid not enlarged, symmetric, no tenderness/mass/nodules Lymph nodes: Cervical, supraclavicular, and axillary nodes normal. Resp: clear to auscultation bilaterally Back: symmetric, no curvature. ROM normal. No CVA tenderness. Cardio: regular rate and rhythm, S1, S2 normal, no murmur, click, rub or gallop GI: soft, non-tender; bowel sounds normal; no masses,  no organomegaly Extremities: extremities normal, atraumatic, no cyanosis or edema Neurologic: Alert and oriented X 3, normal strength and tone. Normal symmetric reflexes. Normal coordination and gait  ECOG PERFORMANCE STATUS: 1 - Symptomatic but completely ambulatory  Blood pressure 116/77, pulse 64, temperature 98 F (36.7 C), temperature source Oral, resp. rate 18, height _0  (2.032 m), weight 212 lb 6.4 oz (96.344 kg), SpO2 100 %.  LABORATORY DATA: Lab Results  Component Value Date   WBC 4.3 10/24/2014   HGB 12.3* 10/24/2014   HCT 39.9 10/24/2014   MCV 81.7 10/24/2014   PLT 284 10/24/2014      Chemistry      Component Value Date/Time   NA 140 10/24/2014 1453   K 4.1 10/24/2014 1453   CO2 27 10/24/2014 1453   BUN 14.1 10/24/2014 1453   CREATININE 1.0 10/24/2014 1453      Component Value Date/Time   CALCIUM 9.6 10/24/2014 1453   ALKPHOS 86 10/24/2014 1453   AST 29 10/24/2014 1453   ALT 18 10/24/2014 1453   BILITOT 0.25 10/24/2014 1453       RADIOGRAPHIC STUDIES: No results found.  ASSESSMENT AND PLAN: This is a very pleasant 59 years old African-American male with metastatic non-small cell lung cancer initially diagnosed as a stage IIIB squamous cell carcinoma but the patient has metastatic disease at the T12 status post decompressive laminectomy and the final pathology was consistent with adenocarcinoma with negative EGFR mutation and negative ALK gene translocation. The recent CT scan of the chest, abdomen and pelvis showed some concerning finding including irregular soft tissue mass within the  right hilum and medial right upper hemithorax concerning for metastatic disease and masslike radiation fibrosis. There was also right periaortic soft tissue mass at the level of the diaphragmatic hiatus concerning for metastatic disease and multiple additional pulmonary nodules. Unfortunately there is no prior CT imaging for comparison and it is unknown of these lesions are new or has been there before. The patient was discussed with and also seen by Dr. Julien Nordmann. We have had him sign a release of information from our medical records department which we will fax to his previous hospital in [place] to try to obtain a CD with his previous imaging studies on it for comparison. In the interim he will continue on observation and return in 2 months with a restaging CT scan of the chest, abdomen and pelvis with contrast to reevaluate his disease. He will continue on MS Contin at 60 mg by mouth every 12 hours and Percocet 5/325 mg for breakthrough pain. He may also take Tylenol and ibuprofen as needed for breakthrough pain as well if he does not want to take the Percocet. Patient voiced understanding and is in agreement with the plan.  He was advised to call immediately if he has any concerning symptoms in the interval. The patient voices understanding of current disease status and treatment options and is in agreement with the current care plan.  All questions were answered. The patient knows to call the clinic with any problems, questions or concerns. We can certainly see the  patient much sooner if necessary.  Carlton Adam, PA-C 10/24/2014  ADDENDUM: Hematology/Oncology Attending: I had a face to face encounter with the patient today. I recommended his care plan. This is a very pleasant 59 years old African-American male with history of metastatic non-small cell lung cancer has been observation for the last few years after treatment in [place]. The patient is feeling fine today except for  the chronic back pain. He will continue his current pain medication with MS Contin in addition to Percocet for breakthrough pain. I recommended for him to continue on observation with repeat CT scan of the chest, abdomen and pelvis in 2 months for restaging of his disease. We will also ask the patient to sign a medical release form so we can obtain his previous scan imaging from [facility]. He was advised to call immediately if he has any concerning symptoms in the interval.  Disclaimer: This note was dictated with voice recognition software. Similar sounding words can inadvertently be transcribed and may be missed upon review. Eilleen Kempf., MD 10/24/2014

## 2014-10-26 ENCOUNTER — Telehealth: Payer: Self-pay | Admitting: Internal Medicine

## 2014-10-26 NOTE — Telephone Encounter (Signed)
Faxed pt release of info. Form to [facility]  And also to Radiology dept.

## 2014-11-14 ENCOUNTER — Telehealth: Payer: Self-pay | Admitting: *Deleted

## 2014-11-14 ENCOUNTER — Other Ambulatory Visit: Payer: Self-pay | Admitting: Medical Oncology

## 2014-11-14 DIAGNOSIS — C3490 Malignant neoplasm of unspecified part of unspecified bronchus or lung: Secondary | ICD-10-CM

## 2014-11-14 MED ORDER — MORPHINE SULFATE ER 30 MG PO TBCR: 30.0000 mg | EXTENDED_RELEASE_TABLET | Freq: Two times a day (BID) | ORAL | Status: DC

## 2014-11-14 MED ORDER — OXYCODONE HCL 5 MG PO TABS: 5.0000 mg | ORAL_TABLET | Freq: Four times a day (QID) | ORAL | Status: DC | PRN

## 2014-11-14 NOTE — Telephone Encounter (Signed)
Would like refill for MS Contin and oxycodone.  He has been trying to take fewer narcotic pain meds and more BC Powders and NSAIDS, but that is not working well.

## 2014-11-14 NOTE — Progress Notes (Signed)
rx locked up for pt pickup tomorrow.

## 2015-01-11 ENCOUNTER — Ambulatory Visit (HOSPITAL_COMMUNITY): Payer: Medicaid Other

## 2015-01-11 ENCOUNTER — Telehealth: Payer: Self-pay | Admitting: Internal Medicine

## 2015-01-11 ENCOUNTER — Telehealth: Payer: Self-pay | Admitting: Oncology

## 2015-01-11 ENCOUNTER — Telehealth: Payer: Self-pay | Admitting: *Deleted

## 2015-01-11 ENCOUNTER — Other Ambulatory Visit: Payer: Medicaid Other

## 2015-01-11 NOTE — Telephone Encounter (Signed)
Lft msg for pt confirming labs/ov. Labs moved from 04/22 to 04/26 due to CT was moved, CT called to state they left msg also for pt...Marland KitchenMarland KitchenMarland Kitchen KJ

## 2015-01-11 NOTE — Telephone Encounter (Signed)
Called pt lmovm regarding CT scan appt 4/25. Pt lab appt missed 4/22 @ 0930, this will be r/s for Monday 4/25. Instructed pt to call office to confirm appts. POF sent to scheduling.

## 2015-01-11 NOTE — Telephone Encounter (Signed)
Called and left a message with a new lab time on 4/25

## 2015-01-14 ENCOUNTER — Other Ambulatory Visit: Payer: Medicaid Other

## 2015-01-15 ENCOUNTER — Encounter: Payer: Self-pay | Admitting: Internal Medicine

## 2015-01-15 ENCOUNTER — Encounter (HOSPITAL_COMMUNITY): Payer: Self-pay

## 2015-01-15 ENCOUNTER — Ambulatory Visit (HOSPITAL_COMMUNITY)
Admission: RE | Admit: 2015-01-15 | Discharge: 2015-01-15 | Disposition: A | Discharge status: Home / Self Care | Payer: Medicaid Other | Source: Ambulatory Visit | Attending: Physician Assistant | Admitting: Physician Assistant

## 2015-01-15 ENCOUNTER — Telehealth: Payer: Self-pay | Admitting: Internal Medicine

## 2015-01-15 ENCOUNTER — Ambulatory Visit (HOSPITAL_BASED_OUTPATIENT_CLINIC_OR_DEPARTMENT_OTHER): Payer: Medicaid Other | Admitting: Internal Medicine

## 2015-01-15 VITALS — BP 117/77 | HR 54 | Temp 97.6°F | Resp 20 | Ht >= 80 in | Wt 208.4 lb

## 2015-01-15 DIAGNOSIS — Z9221 Personal history of antineoplastic chemotherapy: Secondary | ICD-10-CM | POA: Diagnosis not present

## 2015-01-15 DIAGNOSIS — R0602 Shortness of breath: Secondary | ICD-10-CM | POA: Diagnosis not present

## 2015-01-15 DIAGNOSIS — Z923 Personal history of irradiation: Secondary | ICD-10-CM | POA: Insufficient documentation

## 2015-01-15 DIAGNOSIS — C3491 Malignant neoplasm of unspecified part of right bronchus or lung: Secondary | ICD-10-CM | POA: Diagnosis not present

## 2015-01-15 DIAGNOSIS — C3490 Malignant neoplasm of unspecified part of unspecified bronchus or lung: Secondary | ICD-10-CM | POA: Insufficient documentation

## 2015-01-15 DIAGNOSIS — C7951 Secondary malignant neoplasm of bone: Secondary | ICD-10-CM

## 2015-01-15 DIAGNOSIS — Z720 Tobacco use: Secondary | ICD-10-CM | POA: Diagnosis not present

## 2015-01-15 DIAGNOSIS — R079 Chest pain, unspecified: Secondary | ICD-10-CM | POA: Diagnosis not present

## 2015-01-15 DIAGNOSIS — R05 Cough: Secondary | ICD-10-CM | POA: Diagnosis not present

## 2015-01-15 HISTORY — DX: Malignant (primary) neoplasm, unspecified: C80.1

## 2015-01-15 MED ORDER — IOHEXOL 300 MG/ML  SOLN
80.0000 mL | Freq: Once | INTRAMUSCULAR | Status: AC | PRN
  Administered 2015-01-15: 80 mL via INTRAVENOUS

## 2015-01-15 NOTE — Telephone Encounter (Signed)
Gave avs & calendar for July/August. °

## 2015-01-15 NOTE — Progress Notes (Signed)
[facility] [phone]) [phone]   [phone]) [phone]  OFFICE PROGRESS NOTE  Herbert Mccreedy, MD [address] [place] [address]  DIAGNOSIS: Metastatic non-small cell lung cancer, squamous cell carcinoma initially diagnosed as a stage IIIB in March 2011 in [place], [place] THERAPY: 1) status post induction chemotherapy with carboplatin and paclitaxel followed by a course of concurrent chemoradiation with carboplatin and paclitaxel completed in July 2013 with stable disease in the chest. This was under the care of Dr. Bradly Chris in [place]. 2) status post decompressive T11-L1 laminectomies, right T11-12 nerve root division and T11-L1 posterior spinal fusion on 03/18/2014. 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 The tumor was negative for EGFR mutation and negative for ALK gene translocation. 3) status post CyberKnife radiosurgery to the vertebral body tumor in early August 2015.  CURRENT THERAPY: Observation.  INTERVAL HISTORY: Herbert Smith 59 y.o. male returns to the clinic today for follow-up visit. The patient is feeling fine today with no specific complaints. His pain is well-controlled and related as 2 on a scale from 1-10. He is currently on MS Contin 30 mg by mouth twice a day in addition to oxycodone 5 mg by mouth daily for our as needed for pain. He is also on Neurontin 600 mg by mouth 3 times a day. He denied having any other significant complaints today. He has right-sided chest pain but no significant shortness of breath, cough or hemoptysis. He denied having any significant weight loss or night sweats. The patient has no nausea or vomiting, no fever or chills. He had repeat CT scan of the chest performed recently and he is here for evaluation and discussion of his scan results.  MEDICAL HISTORY: Past Medical History  Diagnosis Date  . Cancer    lung ca dx'd 2011    ALLERGIES:  is allergic to fentanyl.  MEDICATIONS:  Current Outpatient Prescriptions  Medication Sig Dispense Refill  . albuterol (PROVENTIL HFA;VENTOLIN HFA) 108 (90 BASE) MCG/ACT inhaler Inhale 2 puffs into the lungs every 6 (six) hours as needed for wheezing or shortness of breath.    . cyclobenzaprine (FLEXERIL) 10 MG tablet Take 10 mg by mouth.  3  . gabapentin (NEURONTIN) 300 MG capsule Take 300 mg by mouth 3 (three) times daily.  3  . ibuprofen (ADVIL,MOTRIN) 200 MG tablet Take 200 mg by mouth every 6 (six) hours as needed.    Marland Kitchen morphine (MS CONTIN) 30 MG 12 hr tablet Take 1 tablet (30 mg total) by mouth every 12 (twelve) hours. 60 tablet 0  . oxyCODONE (OXY IR/ROXICODONE) 5 MG immediate release tablet Take 1 tablet (5 mg total) by mouth every 6 (six) hours as needed for severe pain. 60 tablet 0  . penicillin v potassium (VEETID) 500 MG tablet   0  . pregabalin (LYRICA) 100 MG capsule Take 100 mg by mouth 2 (two) times daily.    Marland Kitchen senna (SENOKOT) 8.6 MG tablet Take 1 tablet by mouth 2 (two) times daily.    Marland Kitchen tiotropium (SPIRIVA) 18 MCG inhalation capsule Place 18 mcg into inhaler and inhale 2 (two) times daily.     No current facility-administered medications for this visit.    SURGICAL HISTORY: History reviewed. No pertinent past surgical history.  REVIEW OF SYSTEMS:  Constitutional: positive for fatigue Eyes: negative Ears, nose, mouth, throat, and face: negative Respiratory: positive for pleurisy/chest pain Cardiovascular: negative Gastrointestinal: negative Genitourinary:negative  Integument/breast: negative Hematologic/lymphatic: negative Musculoskeletal:positive for back pain Neurological: negative Behavioral/Psych: negative Endocrine: negative Allergic/Immunologic: negative   PHYSICAL EXAMINATION: General appearance: alert, cooperative, fatigued and no distress Head: Normocephalic, without obvious abnormality, atraumatic Neck: no adenopathy,  no JVD, supple, symmetrical, trachea midline and thyroid not enlarged, symmetric, no tenderness/mass/nodules Lymph nodes: Cervical, supraclavicular, and axillary nodes normal. Resp: clear to auscultation bilaterally Back: symmetric, no curvature. ROM normal. No CVA tenderness. Cardio: regular rate and rhythm, S1, S2 normal, no murmur, click, rub or gallop GI: soft, non-tender; bowel sounds normal; no masses,  no organomegaly Extremities: extremities normal, atraumatic, no cyanosis or edema Neurologic: Alert and oriented X 3, normal strength and tone. Normal symmetric reflexes. Normal coordination and gait  ECOG PERFORMANCE STATUS: 1 - Symptomatic but completely ambulatory  Blood pressure 117/77, pulse 54, temperature 97.6 F (36.4 C), temperature source Oral, resp. rate 20, height _0  (2.032 m), weight 208 lb 6.4 oz (94.53 kg), SpO2 97 %.  LABORATORY DATA: Lab Results  Component Value Date   WBC 4.3 10/24/2014   HGB 12.3* 10/24/2014   HCT 39.9 10/24/2014   MCV 81.7 10/24/2014   PLT 284 10/24/2014      Chemistry      Component Value Date/Time   NA 140 10/24/2014 1453   K 4.1 10/24/2014 1453   CO2 27 10/24/2014 1453   BUN 14.1 10/24/2014 1453   CREATININE 1.0 10/24/2014 1453      Component Value Date/Time   CALCIUM 9.6 10/24/2014 1453   ALKPHOS 86 10/24/2014 1453   AST 29 10/24/2014 1453   ALT 18 10/24/2014 1453   BILITOT 0.25 10/24/2014 1453       RADIOGRAPHIC STUDIES: Ct Chest W Contrast  01/15/2015   CLINICAL DATA:  Followup metastatic squamous cell carcinoma of the right lung. Status post chemotherapy and radiation therapy. Chest pain, cough, and shortness of breath.  EXAM: CT CHEST WITH CONTRAST  TECHNIQUE: Multidetector CT imaging of the chest was performed during intravenous contrast administration.  CONTRAST:  1m OMNIPAQUE IOHEXOL 300 MG/ML  SOLN  COMPARISON:  09/20/2014  FINDINGS: Mediastinum/Lymph Nodes: Right retrocrural soft tissue density shows increase in  size, currently measuring 2.6 x 3.1 cm on image 65 compared to 1.7 x 2.9 cm previously. No other definite sites of lymphadenopathy identified.  Lungs/Pleura: Emphysema again noted with bullous disease enlarged blebs predominately involving the upper lobes. Ill-defined soft tissue density in the posterior medial right hemithorax measures 6.7 x 3.0 cm on image 34, is not significantly changed since prior study. In addition, there is a thin walled cystic lesion in the posterior medial right hemithorax just superior to the right hilum which measures 6.2 x 5.5 cm on image 27 and is not significant changed. Mild right basilar pleural- parenchymal scarring appears stable.  A few scattered sub-cm pulmonary nodules in the left upper and lower lobes remain stable, largest measuring 8 mm on image 47. No new or enlarging pulmonary nodules or masses identified.  Musculoskeletal/Soft Tissues: A sclerotic bone metastasis is again seen involving the T12 vertebral body, which appears stable. Posterior fusion hardware remains in place. No new bone lesions identified.  Upper Abdomen: Adrenal glands and visualized portion of liver remain unremarkable in appearance. Nonobstructive left nephrolithiasis and right renal cyst are also stable.  IMPRESSION: Mild increase in size of right retrocrural lymphadenopathy, suspicious for progressive metastatic disease. No other new or progressive disease identified.  No significant change in masslike opacity in the posterior medial right hemithorax just below the right  hilum, which may represent tumor and/or radiation change.  No significant change in benign appearing cystic lesion in the right suprahilar region, likely representing a fluid filled bulla.  Stable tiny sub-cm left lung nodules.  Stable sclerotic T12 vertebral body bone metastasis.   Electronically Signed   By: Earle Gell M.D.   On: 01/15/2015 09:56    ASSESSMENT AND PLAN: This is a very pleasant 59 years old African-American male  with metastatic non-small cell lung cancer initially diagnosed as a stage IIIB squamous cell carcinoma but the patient has metastatic disease at the T12 status post decompressive laminectomy and the final pathology was consistent with adenocarcinoma with negative EGFR mutation and negative ALK gene translocation. The recent CT scan of the chest showed mild increase in the size of right retrocrural or lymphadenopathy suspicious for disease progression. I discussed the scan results with the patient and offered him to resume systemic treatment with either chemotherapy or immunotherapy but the patient would like to continue on observation for now with close monitoring. I would see him back for follow-up visit in 3 months with repeat CT scan of the chest, abdomen and pelvis for reevaluation of his disease. For pain management, he will continue with the current pain medication with MS Contin and Percocet.  He was advised to call immediately if he has any concerning symptoms in the interval. The patient voices understanding of current disease status and treatment options and is in agreement with the current care plan.  All questions were answered. The patient knows to call the clinic with any problems, questions or concerns. We can certainly see the patient much sooner if necessary.   Disclaimer: This note was dictated with voice recognition software. Similar sounding words can inadvertently be transcribed and may not be corrected upon review.

## 2015-01-15 NOTE — Patient Instructions (Signed)
Smoking Cessation, Tips for Success  If you are ready to quit smoking, congratulations! You have chosen to help yourself be healthier. Cigarettes bring nicotine, tar, carbon monoxide, and other irritants into your body. Your lungs, heart, and blood vessels will be able to work better without these poisons. There are many different ways to quit smoking. Nicotine gum, nicotine patches, a nicotine inhaler, or nicotine nasal spray can help with physical craving. Hypnosis, support groups, and medicines help break the habit of smoking.  WHAT THINGS CAN I DO TO MAKE QUITTING EASIER?   Here are some tips to help you quit for good:  · Pick a date when you will quit smoking completely. Tell all of your friends and family about your plan to quit on that date.  · Do not try to slowly cut down on the number of cigarettes you are smoking. Pick a quit date and quit smoking completely starting on that day.  · Throw away all cigarettes.    · Clean and remove all ashtrays from your home, work, and car.  · On a card, write down your reasons for quitting. Carry the card with you and read it when you get the urge to smoke.  · Cleanse your body of nicotine. Drink enough water and fluids to keep your urine clear or pale yellow. Do this after quitting to flush the nicotine from your body.  · Learn to predict your moods. Do not let a bad situation be your excuse to have a cigarette. Some situations in your life might tempt you into wanting a cigarette.  · Never have "just one" cigarette. It leads to wanting another and another. Remind yourself of your decision to quit.  · Change habits associated with smoking. If you smoked while driving or when feeling stressed, try other activities to replace smoking. Stand up when drinking your coffee. Brush your teeth after eating. Sit in a different chair when you read the paper. Avoid alcohol while trying to quit, and try to drink fewer caffeinated beverages. Alcohol and caffeine may urge you to  smoke.  · Avoid foods and drinks that can trigger a desire to smoke, such as sugary or spicy foods and alcohol.  · Ask people who smoke not to smoke around you.  · Have something planned to do right after eating or having a cup of coffee. For example, plan to take a walk or exercise.  · Try a relaxation exercise to calm you down and decrease your stress. Remember, you may be tense and nervous for the first 2 weeks after you quit, but this will pass.  · Find new activities to keep your hands busy. Play with a pen, coin, or rubber band. Doodle or draw things on paper.  · Brush your teeth right after eating. This will help cut down on the craving for the taste of tobacco after meals. You can also try mouthwash.    · Use oral substitutes in place of cigarettes. Try using lemon drops, carrots, cinnamon sticks, or chewing gum. Keep them handy so they are available when you have the urge to smoke.  · When you have the urge to smoke, try deep breathing.  · Designate your home as a nonsmoking area.  · If you are a heavy smoker, ask your health care provider about a prescription for nicotine chewing gum. It can ease your withdrawal from nicotine.  · Reward yourself. Set aside the cigarette money you save and buy yourself something nice.  · Look for   support from others. Join a support group or smoking cessation program. Ask someone at home or at work to help you with your plan to quit smoking.  · Always ask yourself, "Do I need this cigarette or is this just a reflex?" Tell yourself, "Today, I choose not to smoke," or "I do not want to smoke." You are reminding yourself of your decision to quit.  · Do not replace cigarette smoking with electronic cigarettes (commonly called e-cigarettes). The safety of e-cigarettes is unknown, and some may contain harmful chemicals.  · If you relapse, do not give up! Plan ahead and think about what you will do the next time you get the urge to smoke.  HOW WILL I FEEL WHEN I QUIT SMOKING?  You  may have symptoms of withdrawal because your body is used to nicotine (the addictive substance in cigarettes). You may crave cigarettes, be irritable, feel very hungry, cough often, get headaches, or have difficulty concentrating. The withdrawal symptoms are only temporary. They are strongest when you first quit but will go away within 10-14 days. When withdrawal symptoms occur, stay in control. Think about your reasons for quitting. Remind yourself that these are signs that your body is healing and getting used to being without cigarettes. Remember that withdrawal symptoms are easier to treat than the major diseases that smoking can cause.   Even after the withdrawal is over, expect periodic urges to smoke. However, these cravings are generally short lived and will go away whether you smoke or not. Do not smoke!  WHAT RESOURCES ARE AVAILABLE TO HELP ME QUIT SMOKING?  Your health care provider can direct you to community resources or hospitals for support, which may include:  · Group support.  · Education.  · Hypnosis.  · Therapy.  Document Released: 06/05/2004 Document Revised: 01/22/2014 Document Reviewed: 02/23/2013  [employer]® Patient Information ©2015 [employer]. This information is not intended to replace advice given to you by your health care provider. Make sure you discuss any questions you have with your health care provider.

## 2015-01-15 NOTE — Progress Notes (Signed)
CT chest approved for 01-15-15, per Vaughan Basta RN  CT AP denied by [facility]

## 2015-01-25 ENCOUNTER — Telehealth: Payer: Self-pay | Admitting: *Deleted

## 2015-01-25 NOTE — Telephone Encounter (Signed)
PT. WOULD LIKE TO PICK UP HIS PRESCRIPTIONS ON Monday AT 10:00AM.

## 2015-01-26 NOTE — Telephone Encounter (Signed)
OK to refill

## 2015-01-28 ENCOUNTER — Other Ambulatory Visit: Payer: Self-pay | Admitting: *Deleted

## 2015-01-28 DIAGNOSIS — C3490 Malignant neoplasm of unspecified part of unspecified bronchus or lung: Secondary | ICD-10-CM

## 2015-01-28 MED ORDER — MORPHINE SULFATE ER 30 MG PO TBCR: 30.0000 mg | EXTENDED_RELEASE_TABLET | Freq: Two times a day (BID) | ORAL | Status: DC

## 2015-01-28 MED ORDER — OXYCODONE HCL 5 MG PO TABS: 5.0000 mg | ORAL_TABLET | Freq: Four times a day (QID) | ORAL | Status: DC | PRN

## 2015-01-28 NOTE — Telephone Encounter (Signed)
Called pt regarding Refills for pain medication ready for pick up

## 2015-02-04 ENCOUNTER — Telehealth: Payer: Self-pay | Admitting: *Deleted

## 2015-02-04 NOTE — Telephone Encounter (Signed)
INFORMED PT. THE PRESCRIPTION HAVE BEEN READY SINCE 01/28/15. HE VOICES UNDERSTANDING.

## 2015-03-12 ENCOUNTER — Telehealth: Payer: Self-pay | Admitting: *Deleted

## 2015-03-12 NOTE — Telephone Encounter (Signed)
VM message received @ 11:06 am regarding clarification on his appts. LVM to identified phone # with appt dates and times.

## 2015-03-22 ENCOUNTER — Other Ambulatory Visit: Payer: Self-pay | Admitting: *Deleted

## 2015-03-22 DIAGNOSIS — C3490 Malignant neoplasm of unspecified part of unspecified bronchus or lung: Secondary | ICD-10-CM

## 2015-03-22 MED ORDER — MORPHINE SULFATE ER 30 MG PO TBCR: 30.0000 mg | EXTENDED_RELEASE_TABLET | Freq: Two times a day (BID) | ORAL | Status: DC

## 2015-03-22 NOTE — Telephone Encounter (Signed)
Refill: Morphine 30 mg. Patient notified and verbalized understanding.

## 2015-04-05 ENCOUNTER — Other Ambulatory Visit: Payer: Self-pay | Admitting: *Deleted

## 2015-04-05 DIAGNOSIS — C3490 Malignant neoplasm of unspecified part of unspecified bronchus or lung: Secondary | ICD-10-CM

## 2015-04-05 MED ORDER — OXYCODONE HCL 5 MG PO TABS: 5.0000 mg | ORAL_TABLET | Freq: Four times a day (QID) | ORAL | Status: DC | PRN

## 2015-04-16 ENCOUNTER — Other Ambulatory Visit (HOSPITAL_BASED_OUTPATIENT_CLINIC_OR_DEPARTMENT_OTHER): Payer: Medicaid Other

## 2015-04-16 DIAGNOSIS — C3491 Malignant neoplasm of unspecified part of right bronchus or lung: Secondary | ICD-10-CM | POA: Diagnosis not present

## 2015-04-16 LAB — COMPREHENSIVE METABOLIC PANEL (CC13)
ALBUMIN: 3.7 g/dL (ref 3.5–5.0)
ALK PHOS: 77 U/L (ref 40–150)
ALT: 20 U/L (ref 0–55)
AST: 22 U/L (ref 5–34)
Anion Gap: 6 mEq/L (ref 3–11)
BUN: 13 mg/dL (ref 7.0–26.0)
CHLORIDE: 106 meq/L (ref 98–109)
CO2: 28 mEq/L (ref 22–29)
Calcium: 10.1 mg/dL (ref 8.4–10.4)
Creatinine: 1 mg/dL (ref 0.7–1.3)
Glucose: 87 mg/dl (ref 70–140)
Potassium: 4.8 mEq/L (ref 3.5–5.1)
Sodium: 139 mEq/L (ref 136–145)
TOTAL PROTEIN: 7.3 g/dL (ref 6.4–8.3)
Total Bilirubin: 0.27 mg/dL (ref 0.20–1.20)

## 2015-04-16 LAB — CBC WITH DIFFERENTIAL/PLATELET
BASO%: 0.5 % (ref 0.0–2.0)
Basophils Absolute: 0 10*3/uL (ref 0.0–0.1)
EOS%: 4.2 % (ref 0.0–7.0)
Eosinophils Absolute: 0.2 10*3/uL (ref 0.0–0.5)
HEMATOCRIT: 41.4 % (ref 38.4–49.9)
HEMOGLOBIN: 13.6 g/dL (ref 13.0–17.1)
LYMPH%: 36.6 % (ref 14.0–49.0)
MCH: 27.6 pg (ref 27.2–33.4)
MCHC: 32.9 g/dL (ref 32.0–36.0)
MCV: 84.1 fL (ref 79.3–98.0)
MONO#: 0.4 10*3/uL (ref 0.1–0.9)
MONO%: 10 % (ref 0.0–14.0)
NEUT#: 1.9 10*3/uL (ref 1.5–6.5)
NEUT%: 48.7 % (ref 39.0–75.0)
Platelets: 229 10*3/uL (ref 140–400)
RBC: 4.92 10*6/uL (ref 4.20–5.82)
RDW: 16.7 % — ABNORMAL HIGH (ref 11.0–14.6)
WBC: 3.8 10*3/uL — ABNORMAL LOW (ref 4.0–10.3)
lymph#: 1.4 10*3/uL (ref 0.9–3.3)

## 2015-04-23 ENCOUNTER — Telehealth: Payer: Self-pay | Admitting: Internal Medicine

## 2015-04-23 ENCOUNTER — Encounter: Payer: Self-pay | Admitting: Internal Medicine

## 2015-04-23 ENCOUNTER — Other Ambulatory Visit: Payer: Self-pay | Admitting: Medical Oncology

## 2015-04-23 ENCOUNTER — Ambulatory Visit (HOSPITAL_BASED_OUTPATIENT_CLINIC_OR_DEPARTMENT_OTHER): Payer: Medicaid Other | Admitting: Internal Medicine

## 2015-04-23 VITALS — BP 127/75 | HR 52 | Temp 98.1°F | Resp 18 | Ht >= 80 in | Wt 208.2 lb

## 2015-04-23 DIAGNOSIS — C7951 Secondary malignant neoplasm of bone: Secondary | ICD-10-CM | POA: Diagnosis not present

## 2015-04-23 DIAGNOSIS — C3491 Malignant neoplasm of unspecified part of right bronchus or lung: Secondary | ICD-10-CM | POA: Diagnosis not present

## 2015-04-23 NOTE — Patient Instructions (Signed)
Smoking Cessation Quitting smoking is important to your health and has many advantages. However, it is not always easy to quit since nicotine is a very addictive drug. Oftentimes, people try 3 times or more before being able to quit. This document explains the best ways for you to prepare to quit smoking. Quitting takes hard work and a lot of effort, but you can do it. ADVANTAGES OF QUITTING SMOKING  You will live longer, feel better, and live better.  Your body will feel the impact of quitting smoking almost immediately.  Within 20 minutes, blood pressure decreases. Your pulse returns to its normal level.  After 8 hours, carbon monoxide levels in the blood return to normal. Your oxygen level increases.  After 24 hours, the chance of having a heart attack starts to decrease. Your breath, hair, and body stop smelling like smoke.  After 48 hours, damaged nerve endings begin to recover. Your sense of taste and smell improve.  After 72 hours, the body is virtually free of nicotine. Your bronchial tubes relax and breathing becomes easier.  After 2 to 12 weeks, lungs can hold more air. Exercise becomes easier and circulation improves.  The risk of having a heart attack, stroke, cancer, or lung disease is greatly reduced.  After 1 year, the risk of coronary heart disease is cut in half.  After 5 years, the risk of stroke falls to the same as a nonsmoker.  After 10 years, the risk of lung cancer is cut in half and the risk of other cancers decreases significantly.  After 15 years, the risk of coronary heart disease drops, usually to the level of a nonsmoker.  If you are pregnant, quitting smoking will improve your chances of having a healthy baby.  The people you live with, especially any children, will be healthier.  You will have extra money to spend on things other than cigarettes. QUESTIONS TO THINK ABOUT BEFORE ATTEMPTING TO QUIT You may want to talk about your answers with your  health care provider.  Why do you want to quit?  If you tried to quit in the past, what helped and what did not?  What will be the most difficult situations for you after you quit? How will you plan to handle them?  Who can help you through the tough times? Your family? Friends? A health care provider?  What pleasures do you get from smoking? What ways can you still get pleasure if you quit? Here are some questions to ask your health care provider:  How can you help me to be successful at quitting?  What medicine do you think would be best for me and how should I take it?  What should I do if I need more help?  What is smoking withdrawal like? How can I get information on withdrawal? GET READY  Set a quit date.  Change your environment by getting rid of all cigarettes, ashtrays, matches, and lighters in your home, car, or work. Do not let people smoke in your home.  Review your past attempts to quit. Think about what worked and what did not. GET SUPPORT AND ENCOURAGEMENT You have a better chance of being successful if you have help. You can get support in many ways.  Tell your family, friends, and coworkers that you are going to quit and need their support. Ask them not to smoke around you.  Get individual, group, or telephone counseling and support. Programs are available at local hospitals and health centers. Call   your local health department for information about programs in your area.  Spiritual beliefs and practices may help some smokers quit.  Download a "quit meter" on your computer to keep track of quit statistics, such as how long you have gone without smoking, cigarettes not smoked, and money saved.  Get a self-help book about quitting smoking and staying off tobacco. LEARN NEW SKILLS AND BEHAVIORS  Distract yourself from urges to smoke. Talk to someone, go for a walk, or occupy your time with a task.  Change your normal routine. Take a different route to work.  Drink tea instead of coffee. Eat breakfast in a different place.  Reduce your stress. Take a hot bath, exercise, or read a book.  Plan something enjoyable to do every day. Reward yourself for not smoking.  Explore interactive web-based programs that specialize in helping you quit. GET MEDICINE AND USE IT CORRECTLY Medicines can help you stop smoking and decrease the urge to smoke. Combining medicine with the above behavioral methods and support can greatly increase your chances of successfully quitting smoking.  Nicotine replacement therapy helps deliver nicotine to your body without the negative effects and risks of smoking. Nicotine replacement therapy includes nicotine gum, lozenges, inhalers, nasal sprays, and skin patches. Some may be available over-the-counter and others require a prescription.  Antidepressant medicine helps people abstain from smoking, but how this works is unknown. This medicine is available by prescription.  Nicotinic receptor partial agonist medicine simulates the effect of nicotine in your brain. This medicine is available by prescription. Ask your health care provider for advice about which medicines to use and how to use them based on your health history. Your health care provider will tell you what side effects to look out for if you choose to be on a medicine or therapy. Carefully read the information on the package. Do not use any other product containing nicotine while using a nicotine replacement product.  RELAPSE OR DIFFICULT SITUATIONS Most relapses occur within the first 3 months after quitting. Do not be discouraged if you start smoking again. Remember, most people try several times before finally quitting. You may have symptoms of withdrawal because your body is used to nicotine. You may crave cigarettes, be irritable, feel very hungry, cough often, get headaches, or have difficulty concentrating. The withdrawal symptoms are only temporary. They are strongest  when you first quit, but they will go away within 10-14 days. To reduce the chances of relapse, try to:  Avoid drinking alcohol. Drinking lowers your chances of successfully quitting.  Reduce the amount of caffeine you consume. Once you quit smoking, the amount of caffeine in your body increases and can give you symptoms, such as a rapid heartbeat, sweating, and anxiety.  Avoid smokers because they can make you want to smoke.  Do not let weight gain distract you. Many smokers will gain weight when they quit, usually less than 10 pounds. Eat a healthy diet and stay active. You can always lose the weight gained after you quit.  Find ways to improve your mood other than smoking. FOR MORE INFORMATION  www.smokefree.gov  Document Released: 09/01/2001 Document Revised: 01/22/2014 Document Reviewed: 12/17/2011 [employer] Patient Information 2015 [employer]. This information is not intended to replace advice given to you by your health care provider. Make sure you discuss any questions you have with your health care provider.  

## 2015-04-23 NOTE — Telephone Encounter (Signed)
Gave adn printed appt sched and avs for pt fo rDEC....gv barium

## 2015-04-23 NOTE — Progress Notes (Signed)
[place] Telephone:([phone]   Fax:(336) [phone]  OFFICE PROGRESS NOTE  Benito Mccreedy, MD [address] [place] [address]  DIAGNOSIS: Metastatic non-small cell lung cancer, squamous cell carcinoma initially diagnosed as a stage IIIB in March 2011 in [place], [place] THERAPY: 1) status post induction chemotherapy with carboplatin and paclitaxel followed by a course of concurrent chemoradiation with carboplatin and paclitaxel completed in July 2013 with stable disease in the chest. This was under the care of Dr. Bradly Chris in [place]. 2) status post decompressive T11-L1 laminectomies, right T11-12 nerve root division and T11-L1 posterior spinal fusion on 03/18/2014. 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 The tumor was negative for EGFR mutation and negative for ALK gene translocation. 3) status post CyberKnife radiosurgery to the vertebral body tumor in early August 2015.  CURRENT THERAPY: Observation.  INTERVAL HISTORY: Herbert Smith 59 y.o. male returns to the clinic today for follow-up visit. The patient is feeling fine today with no specific complaints except for the persistent low back pain rated as 2 on a scale from 1-10. He is currently on MS Contin 30 mg by mouth twice a day in addition to oxycodone 5 mg by mouth daily for our as needed for pain. He is also on Neurontin 600 mg by mouth 3 times a day. He denied having any other significant complaints today. He denied having any significant chest pain, shortness of breath, cough or hemoptysis. He denied having any significant weight loss or night sweats. The patient has no nausea or vomiting, no fever or chills. He was supposed to have repeat CT scan of the chest, abdomen and pelvis on 04/16/2015 but the patient missed his appointment.  MEDICAL HISTORY: Past Medical History  Diagnosis Date  .  Cancer     lung ca dx'd 2011    ALLERGIES:  is allergic to fentanyl.  MEDICATIONS:  Current Outpatient Prescriptions  Medication Sig Dispense Refill  . albuterol (PROVENTIL HFA;VENTOLIN HFA) 108 (90 BASE) MCG/ACT inhaler Inhale 2 puffs into the lungs every 6 (six) hours as needed for wheezing or shortness of breath.    . cyclobenzaprine (FLEXERIL) 10 MG tablet Take 10 mg by mouth.  3  . gabapentin (NEURONTIN) 300 MG capsule Take 300 mg by mouth 3 (three) times daily.  3  . ibuprofen (ADVIL,MOTRIN) 200 MG tablet Take 200 mg by mouth every 6 (six) hours as needed.    Marland Kitchen morphine (MS CONTIN) 30 MG 12 hr tablet Take 1 tablet (30 mg total) by mouth every 12 (twelve) hours. 60 tablet 0  . oxyCODONE (OXY IR/ROXICODONE) 5 MG immediate release tablet Take 1 tablet (5 mg total) by mouth every 6 (six) hours as needed for severe pain. 60 tablet 0  . penicillin v potassium (VEETID) 500 MG tablet   0  . pregabalin (LYRICA) 100 MG capsule Take 100 mg by mouth 2 (two) times daily.    Marland Kitchen senna (SENOKOT) 8.6 MG tablet Take 1 tablet by mouth 2 (two) times daily.    Marland Kitchen tiotropium (SPIRIVA) 18 MCG inhalation capsule Place 18 mcg into inhaler and inhale 2 (two) times daily.     No current facility-administered medications for this visit.    SURGICAL HISTORY: No past surgical history on file.  REVIEW OF SYSTEMS:  Constitutional: positive for fatigue Eyes: negative Ears, nose, mouth, throat, and face: negative Respiratory: positive for pleurisy/chest pain Cardiovascular: negative Gastrointestinal:  negative Genitourinary:negative Integument/breast: negative Hematologic/lymphatic: negative Musculoskeletal:positive for back pain Neurological: negative Behavioral/Psych: negative Endocrine: negative Allergic/Immunologic: negative   PHYSICAL EXAMINATION: General appearance: alert, cooperative, fatigued and no distress Head: Normocephalic, without obvious abnormality, atraumatic Neck: no adenopathy, no JVD,  supple, symmetrical, trachea midline and thyroid not enlarged, symmetric, no tenderness/mass/nodules Lymph nodes: Cervical, supraclavicular, and axillary nodes normal. Resp: clear to auscultation bilaterally Back: symmetric, no curvature. ROM normal. No CVA tenderness. Cardio: regular rate and rhythm, S1, S2 normal, no murmur, click, rub or gallop GI: soft, non-tender; bowel sounds normal; no masses,  no organomegaly Extremities: extremities normal, atraumatic, no cyanosis or edema Neurologic: Alert and oriented X 3, normal strength and tone. Normal symmetric reflexes. Normal coordination and gait  ECOG PERFORMANCE STATUS: 1 - Symptomatic but completely ambulatory  Blood pressure 127/75, pulse 52, temperature 98.1 F (36.7 C), temperature source Oral, resp. rate 18, height $RemoveBe'6\' 8"'zpahIfcWx$  (2.032 m), weight 208 lb 3.2 oz (94.439 kg), SpO2 100 %.  LABORATORY DATA: Lab Results  Component Value Date   WBC 3.8* 04/16/2015   HGB 13.6 04/16/2015   HCT 41.4 04/16/2015   MCV 84.1 04/16/2015   PLT 229 04/16/2015      Chemistry      Component Value Date/Time   NA 139 04/16/2015 1420   K 4.8 04/16/2015 1420   CO2 28 04/16/2015 1420   BUN 13.0 04/16/2015 1420   CREATININE 1.0 04/16/2015 1420      Component Value Date/Time   CALCIUM 10.1 04/16/2015 1420   ALKPHOS 77 04/16/2015 1420   AST 22 04/16/2015 1420   ALT 20 04/16/2015 1420   BILITOT 0.27 04/16/2015 1420       RADIOGRAPHIC STUDIES: No results found.  ASSESSMENT AND PLAN: This is a very pleasant 59 years old African-American male with metastatic non-small cell lung cancer initially diagnosed as a stage IIIB squamous cell carcinoma but the patient has metastatic disease at the T12 status post decompressive laminectomy and the final pathology was consistent with adenocarcinoma with negative EGFR mutation and negative ALK gene translocation. I will arrange for the patient to have repeat CT scan of the chest, abdomen and pelvis in the next  few days for evaluation of his disease and if negative he would come back for follow-up visit in 4 months with repeat imaging studies again. For pain management, he will continue with the current pain medication with MS Contin and Percocet.  He was advised to call immediately if he has any concerning symptoms in the interval. The patient voices understanding of current disease status and treatment options and is in agreement with the current care plan.  All questions were answered. The patient knows to call the clinic with any problems, questions or concerns. We can certainly see the patient much sooner if necessary.   Disclaimer: This note was dictated with voice recognition software. Similar sounding words can inadvertently be transcribed and may not be corrected upon review.

## 2015-05-01 ENCOUNTER — Encounter (HOSPITAL_COMMUNITY): Payer: Self-pay

## 2015-05-01 ENCOUNTER — Ambulatory Visit (HOSPITAL_COMMUNITY)
Admission: RE | Admit: 2015-05-01 | Discharge: 2015-05-01 | Disposition: A | Discharge status: Home / Self Care | Payer: Medicaid Other | Source: Ambulatory Visit | Attending: Internal Medicine | Admitting: Internal Medicine

## 2015-05-01 DIAGNOSIS — Z9221 Personal history of antineoplastic chemotherapy: Secondary | ICD-10-CM | POA: Diagnosis not present

## 2015-05-01 DIAGNOSIS — I700 Atherosclerosis of aorta: Secondary | ICD-10-CM | POA: Insufficient documentation

## 2015-05-01 DIAGNOSIS — C3491 Malignant neoplasm of unspecified part of right bronchus or lung: Secondary | ICD-10-CM | POA: Diagnosis not present

## 2015-05-01 DIAGNOSIS — Z923 Personal history of irradiation: Secondary | ICD-10-CM | POA: Diagnosis not present

## 2015-05-01 DIAGNOSIS — N281 Cyst of kidney, acquired: Secondary | ICD-10-CM | POA: Diagnosis not present

## 2015-05-01 DIAGNOSIS — C7951 Secondary malignant neoplasm of bone: Secondary | ICD-10-CM | POA: Diagnosis not present

## 2015-05-01 DIAGNOSIS — J439 Emphysema, unspecified: Secondary | ICD-10-CM | POA: Insufficient documentation

## 2015-05-01 DIAGNOSIS — N200 Calculus of kidney: Secondary | ICD-10-CM | POA: Diagnosis not present

## 2015-05-01 MED ORDER — IOHEXOL 300 MG/ML  SOLN
100.0000 mL | Freq: Once | INTRAMUSCULAR | Status: AC | PRN
  Administered 2015-05-01: 100 mL via INTRAVENOUS

## 2015-05-01 MED ORDER — IOHEXOL 300 MG/ML  SOLN: 75.0000 mL | Freq: Once | INTRAMUSCULAR | Status: DC | PRN

## 2015-05-20 ENCOUNTER — Telehealth: Payer: Self-pay | Admitting: *Deleted

## 2015-05-20 ENCOUNTER — Telehealth: Payer: Self-pay | Admitting: Medical Oncology

## 2015-05-20 DIAGNOSIS — C3490 Malignant neoplasm of unspecified part of unspecified bronchus or lung: Secondary | ICD-10-CM

## 2015-05-20 MED ORDER — OXYCODONE HCL 5 MG PO TABS: 5.0000 mg | ORAL_TABLET | Freq: Four times a day (QID) | ORAL | Status: DC | PRN

## 2015-05-20 MED ORDER — MORPHINE SULFATE ER 30 MG PO TBCR: 30.0000 mg | EXTENDED_RELEASE_TABLET | Freq: Two times a day (BID) | ORAL | Status: DC

## 2015-05-20 NOTE — Telephone Encounter (Signed)
Pt called earlier today to pick up rx tomorrow.

## 2015-05-20 NOTE — Telephone Encounter (Signed)
"  I ned a refill on medication.  Please call [phone]."  routed to [facility].

## 2015-05-20 NOTE — Telephone Encounter (Signed)
I returned call to this number as no message left. I called pt and he needs refill on his pain meds. Rx locked up and pt notified to pick up today by 415pm or tomorrow.

## 2015-05-24 ENCOUNTER — Telehealth: Payer: Self-pay | Admitting: *Deleted

## 2015-05-24 NOTE — Telephone Encounter (Signed)
NOTIFIED PT. THAT HIS PRESCRIPTIONS ARE READY.

## 2015-06-28 ENCOUNTER — Other Ambulatory Visit: Payer: Self-pay | Admitting: Medical Oncology

## 2015-06-28 DIAGNOSIS — C3490 Malignant neoplasm of unspecified part of unspecified bronchus or lung: Secondary | ICD-10-CM

## 2015-06-28 MED ORDER — MORPHINE SULFATE ER 30 MG PO TBCR: 30.0000 mg | EXTENDED_RELEASE_TABLET | Freq: Two times a day (BID) | ORAL | Status: DC

## 2015-06-28 MED ORDER — OXYCODONE HCL 5 MG PO TABS: 5.0000 mg | ORAL_TABLET | Freq: Four times a day (QID) | ORAL | Status: DC | PRN

## 2015-06-28 NOTE — Progress Notes (Signed)
Message left on pt phone that rx x2 ready for pickup.

## 2015-07-25 ENCOUNTER — Telehealth: Payer: Self-pay

## 2015-07-25 DIAGNOSIS — C3490 Malignant neoplasm of unspecified part of unspecified bronchus or lung: Secondary | ICD-10-CM

## 2015-07-25 MED ORDER — OXYCODONE HCL 5 MG PO TABS: 5.0000 mg | ORAL_TABLET | Freq: Four times a day (QID) | ORAL | Status: DC | PRN

## 2015-07-25 NOTE — Telephone Encounter (Signed)
Pt called requesting medication refill. Called him back to clarify which medication. He states he has ms contin, he needs his oxycodone. rx prepared for signature.

## 2015-08-06 ENCOUNTER — Other Ambulatory Visit (HOSPITAL_COMMUNITY): Payer: Self-pay | Admitting: Physician Assistant

## 2015-08-06 DIAGNOSIS — R109 Unspecified abdominal pain: Secondary | ICD-10-CM

## 2015-08-13 ENCOUNTER — Ambulatory Visit (HOSPITAL_COMMUNITY): Admission: RE | Admit: 2015-08-13 | Payer: Medicaid Other | Source: Ambulatory Visit

## 2015-08-26 ENCOUNTER — Ambulatory Visit (HOSPITAL_COMMUNITY): Admission: RE | Admit: 2015-08-26 | Payer: Medicaid Other | Source: Ambulatory Visit

## 2015-08-27 ENCOUNTER — Ambulatory Visit (HOSPITAL_COMMUNITY): Payer: Medicaid Other

## 2015-08-27 ENCOUNTER — Telehealth: Payer: Self-pay | Admitting: *Deleted

## 2015-08-27 ENCOUNTER — Ambulatory Visit (HOSPITAL_BASED_OUTPATIENT_CLINIC_OR_DEPARTMENT_OTHER): Payer: Medicaid Other

## 2015-08-27 ENCOUNTER — Other Ambulatory Visit: Payer: Medicaid Other

## 2015-08-27 ENCOUNTER — Other Ambulatory Visit: Payer: Self-pay | Admitting: Medical Oncology

## 2015-08-27 DIAGNOSIS — C3491 Malignant neoplasm of unspecified part of right bronchus or lung: Secondary | ICD-10-CM

## 2015-08-27 DIAGNOSIS — C3490 Malignant neoplasm of unspecified part of unspecified bronchus or lung: Secondary | ICD-10-CM

## 2015-08-27 LAB — COMPREHENSIVE METABOLIC PANEL
ALT: 17 U/L (ref 0–55)
ANION GAP: 7 meq/L (ref 3–11)
AST: 28 U/L (ref 5–34)
Albumin: 3.8 g/dL (ref 3.5–5.0)
Alkaline Phosphatase: 79 U/L (ref 40–150)
BUN: 18.7 mg/dL (ref 7.0–26.0)
CALCIUM: 10.2 mg/dL (ref 8.4–10.4)
CHLORIDE: 107 meq/L (ref 98–109)
CO2: 25 meq/L (ref 22–29)
Creatinine: 1.1 mg/dL (ref 0.7–1.3)
EGFR: 84 mL/min/{1.73_m2} — ABNORMAL LOW (ref >90)
Glucose: 91 mg/dl (ref 70–140)
POTASSIUM: 4.6 meq/L (ref 3.5–5.1)
Sodium: 140 mEq/L (ref 136–145)
Total Bilirubin: 0.45 mg/dL (ref 0.20–1.20)
Total Protein: 7.7 g/dL (ref 6.4–8.3)

## 2015-08-27 LAB — CBC WITH DIFFERENTIAL/PLATELET
BASO%: 0.5 % (ref 0.0–2.0)
BASOS ABS: 0 10*3/uL (ref 0.0–0.1)
EOS ABS: 0.1 10*3/uL (ref 0.0–0.5)
EOS%: 2 % (ref 0.0–7.0)
HCT: 43.4 % (ref 38.4–49.9)
HEMOGLOBIN: 13.9 g/dL (ref 13.0–17.1)
LYMPH%: 31.4 % (ref 14.0–49.0)
MCH: 27.5 pg (ref 27.2–33.4)
MCHC: 32 g/dL (ref 32.0–36.0)
MCV: 85.9 fL (ref 79.3–98.0)
MONO#: 0.3 10*3/uL (ref 0.1–0.9)
MONO%: 7.5 % (ref 0.0–14.0)
NEUT%: 58.6 % (ref 39.0–75.0)
NEUTROS ABS: 2.1 10*3/uL (ref 1.5–6.5)
PLATELETS: 241 10*3/uL (ref 140–400)
RBC: 5.05 10*6/uL (ref 4.20–5.82)
RDW: 16.7 % — AB (ref 11.0–14.6)
WBC: 3.6 10*3/uL — AB (ref 4.0–10.3)
lymph#: 1.1 10*3/uL (ref 0.9–3.3)

## 2015-08-27 MED ORDER — MORPHINE SULFATE ER 30 MG PO TBCR: 30.0000 mg | EXTENDED_RELEASE_TABLET | Freq: Two times a day (BID) | ORAL | Status: DC

## 2015-08-27 MED ORDER — OXYCODONE HCL 5 MG PO TABS: 5.0000 mg | ORAL_TABLET | Freq: Four times a day (QID) | ORAL | Status: DC | PRN

## 2015-08-27 NOTE — Telephone Encounter (Signed)
Pt called requesting refill of medications.  Spoke with pt and was informed that pt needed refills of MS Contin and Oxycodone.  Nurse noted pt did not show for STAT lab and CT scan today.  Gave pt radiology phone number to call for CT scan rescheduling soon before office visit on 09/03/15.   Instructed pt to come in this afternoon for labs prior to scan.  Pt voiced understanding. Informed pt that he can pick up scripts for pain meds when he comes in for lab today.   Pt's   Phone    [phone].

## 2015-08-27 NOTE — Progress Notes (Signed)
Pain meds rx locked up .

## 2015-09-02 ENCOUNTER — Ambulatory Visit (HOSPITAL_COMMUNITY): Payer: Medicaid Other

## 2015-09-02 ENCOUNTER — Telehealth: Payer: Self-pay | Admitting: *Deleted

## 2015-09-02 NOTE — Telephone Encounter (Signed)
Patient called this am.  He got a letter Saturday saying his CT scan for today was cancelled because medicaid did not approve it.  Let patient know that I would notify Dr. Julien Nordmann and that patient should keep his appt. With Dr. Julien Nordmann tomorrow on 09/03/15.  Patient call back is [phone] if needed today.

## 2015-09-03 ENCOUNTER — Ambulatory Visit: Payer: Medicaid Other | Admitting: Internal Medicine

## 2015-09-03 ENCOUNTER — Telehealth: Payer: Self-pay | Admitting: *Deleted

## 2015-09-03 ENCOUNTER — Telehealth: Payer: Self-pay | Admitting: Medical Oncology

## 2015-09-03 ENCOUNTER — Telehealth: Payer: Self-pay | Admitting: Internal Medicine

## 2015-09-03 NOTE — Telephone Encounter (Signed)
returned call and lvm for pt with new d.t for appt after ct scan

## 2015-09-03 NOTE — Telephone Encounter (Signed)
Unable to reach pt about new appts.

## 2015-09-03 NOTE — Telephone Encounter (Signed)
FYI Voicemail: ":I had a 10:00 appointment today with Dr.Mohanmed, I couldn't make it and I'm calling to try to reschedule.  Please call."

## 2015-09-04 NOTE — Telephone Encounter (Signed)
Pt was r/s and message left on his phone  Yesterday.

## 2015-09-09 ENCOUNTER — Ambulatory Visit (HOSPITAL_COMMUNITY): Payer: Medicaid Other

## 2015-09-12 ENCOUNTER — Telehealth: Payer: Self-pay | Admitting: Medical Oncology

## 2015-09-12 NOTE — Telephone Encounter (Signed)
Herbert Smith will call pt.

## 2015-09-13 ENCOUNTER — Encounter (HOSPITAL_COMMUNITY): Payer: Self-pay

## 2015-09-13 ENCOUNTER — Other Ambulatory Visit: Payer: Self-pay | Admitting: Medical Oncology

## 2015-09-13 ENCOUNTER — Ambulatory Visit (HOSPITAL_COMMUNITY)
Admission: RE | Admit: 2015-09-13 | Discharge: 2015-09-13 | Disposition: A | Discharge status: Home / Self Care | Payer: Medicaid Other | Source: Ambulatory Visit | Attending: Internal Medicine | Admitting: Internal Medicine

## 2015-09-13 ENCOUNTER — Ambulatory Visit (HOSPITAL_COMMUNITY): Payer: Medicaid Other

## 2015-09-13 ENCOUNTER — Telehealth: Payer: Self-pay | Admitting: Medical Oncology

## 2015-09-13 DIAGNOSIS — I700 Atherosclerosis of aorta: Secondary | ICD-10-CM | POA: Diagnosis not present

## 2015-09-13 DIAGNOSIS — M546 Pain in thoracic spine: Secondary | ICD-10-CM

## 2015-09-13 DIAGNOSIS — C3490 Malignant neoplasm of unspecified part of unspecified bronchus or lung: Secondary | ICD-10-CM

## 2015-09-13 DIAGNOSIS — C3491 Malignant neoplasm of unspecified part of right bronchus or lung: Secondary | ICD-10-CM

## 2015-09-13 DIAGNOSIS — R918 Other nonspecific abnormal finding of lung field: Secondary | ICD-10-CM | POA: Insufficient documentation

## 2015-09-13 DIAGNOSIS — R079 Chest pain, unspecified: Secondary | ICD-10-CM | POA: Insufficient documentation

## 2015-09-13 DIAGNOSIS — N200 Calculus of kidney: Secondary | ICD-10-CM | POA: Insufficient documentation

## 2015-09-13 DIAGNOSIS — C7951 Secondary malignant neoplasm of bone: Secondary | ICD-10-CM | POA: Diagnosis not present

## 2015-09-13 DIAGNOSIS — R0602 Shortness of breath: Secondary | ICD-10-CM | POA: Diagnosis not present

## 2015-09-13 DIAGNOSIS — J9811 Atelectasis: Secondary | ICD-10-CM | POA: Diagnosis not present

## 2015-09-13 DIAGNOSIS — R109 Unspecified abdominal pain: Secondary | ICD-10-CM | POA: Diagnosis not present

## 2015-09-13 MED ORDER — IOHEXOL 300 MG/ML  SOLN
100.0000 mL | Freq: Once | INTRAMUSCULAR | Status: AC | PRN
  Administered 2015-09-13: 100 mL via INTRAVENOUS

## 2015-09-13 MED ORDER — IOHEXOL 300 MG/ML  SOLN
50.0000 mL | Freq: Once | INTRAMUSCULAR | Status: AC | PRN
  Administered 2015-09-13: 50 mL via ORAL

## 2015-09-13 MED ORDER — OXYCODONE HCL 5 MG PO TABS: 5.0000 mg | ORAL_TABLET | Freq: Four times a day (QID) | ORAL | Status: DC | PRN

## 2015-09-13 NOTE — Telephone Encounter (Addendum)
Pt seen in lobby , reports his pain at Right lateral chest and flank is increasing and his pain meds not effective. He is holding his right chest, grimacing  and  SOB. Will talk to [facility]. Per Mohamed stat Ct . Pt CT scan denied again by insurance -linda davis to resubmit. Pt notified to wait for further instructions.

## 2015-09-17 ENCOUNTER — Ambulatory Visit (HOSPITAL_BASED_OUTPATIENT_CLINIC_OR_DEPARTMENT_OTHER): Payer: Medicaid Other | Admitting: Internal Medicine

## 2015-09-17 ENCOUNTER — Encounter: Payer: Self-pay | Admitting: Internal Medicine

## 2015-09-17 VITALS — BP 130/73 | HR 52 | Temp 97.4°F | Resp 18 | Ht >= 80 in | Wt 196.6 lb

## 2015-09-17 DIAGNOSIS — C7951 Secondary malignant neoplasm of bone: Secondary | ICD-10-CM

## 2015-09-17 DIAGNOSIS — C3411 Malignant neoplasm of upper lobe, right bronchus or lung: Secondary | ICD-10-CM

## 2015-09-17 DIAGNOSIS — M545 Low back pain: Secondary | ICD-10-CM

## 2015-09-17 DIAGNOSIS — C3490 Malignant neoplasm of unspecified part of unspecified bronchus or lung: Secondary | ICD-10-CM

## 2015-09-17 NOTE — Progress Notes (Signed)
[place] Telephone:([phone]   Fax:(336) [phone]  OFFICE PROGRESS NOTE  Benito Mccreedy, MD [address] [place] [address]  DIAGNOSIS: Metastatic non-small cell lung cancer, squamous cell carcinoma initially diagnosed as a stage IIIB in March 2011 in [place], [place] THERAPY: 1) status post induction chemotherapy with carboplatin and paclitaxel followed by a course of concurrent chemoradiation with carboplatin and paclitaxel completed in July 2013 with stable disease in the chest. This was under the care of Dr. Bradly Chris in [place]. 2) status post decompressive T11-L1 laminectomies, right T11-12 nerve root division and T11-L1 posterior spinal fusion on 03/18/2014. 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 The tumor was negative for EGFR mutation and negative for ALK gene translocation. 3) status post CyberKnife radiosurgery to the vertebral body tumor in early August 2015.  CURRENT THERAPY: Observation.  INTERVAL HISTORY: Herbert Smith 59 y.o. male returns to the clinic today for follow-up visit. The patient is feeling fine today with no specific complaints except for the persistent low back pain. He is currently on MS Contin 30 mg by mouth twice a day in addition to oxycodone 5 mg by mouth daily for our as needed for pain. He is also on Neurontin 600 mg by mouth 3 times a day in addition to when necessary Aleve. He denied having any other significant complaints today. He denied having any significant chest pain, shortness of breath, cough or hemoptysis. He denied having any significant weight loss or night sweats. The patient has no nausea or vomiting, no fever or chills. His pain was getting worse and he had repeat CT scan of the chest, abdomen and pelvis performed recently and he is here for evaluation and discussion of his scan results.  MEDICAL  HISTORY: Past Medical History  Diagnosis Date  . Cancer ([place])     lung ca dx'd 2011    ALLERGIES:  is allergic to fentanyl.  MEDICATIONS:  Current Outpatient Prescriptions  Medication Sig Dispense Refill  . albuterol (PROVENTIL HFA;VENTOLIN HFA) 108 (90 BASE) MCG/ACT inhaler Inhale 2 puffs into the lungs every 6 (six) hours as needed for wheezing or shortness of breath.    Marland Kitchen amoxicillin-clarithromycin-lansoprazole (PREVPAC) combo pack See admin instructions.  0  . cyclobenzaprine (FLEXERIL) 10 MG tablet Take 10 mg by mouth.  3  . gabapentin (NEURONTIN) 300 MG capsule Take 300 mg by mouth 3 (three) times daily.  3  . ibuprofen (ADVIL,MOTRIN) 200 MG tablet Take 200 mg by mouth every 6 (six) hours as needed.    Marland Kitchen morphine (MS CONTIN) 30 MG 12 hr tablet Take 1 tablet (30 mg total) by mouth every 12 (twelve) hours. 60 tablet 0  . oxyCODONE (OXY IR/ROXICODONE) 5 MG immediate release tablet Take 1 tablet (5 mg total) by mouth every 6 (six) hours as needed for severe pain. 60 tablet 0  . penicillin v potassium (VEETID) 500 MG tablet   0  . pregabalin (LYRICA) 100 MG capsule Take 100 mg by mouth 2 (two) times daily.    Marland Kitchen senna (SENOKOT) 8.6 MG tablet Take 1 tablet by mouth 2 (two) times daily.    Marland Kitchen tiotropium (SPIRIVA) 18 MCG inhalation capsule Place 18 mcg into inhaler and inhale 2 (two) times daily.     No current facility-administered medications for this visit.    SURGICAL HISTORY: History reviewed. No pertinent past surgical history.  REVIEW OF SYSTEMS:  Constitutional:  positive for fatigue Eyes: negative Ears, nose, mouth, throat, and face: negative Respiratory: positive for pleurisy/chest pain Cardiovascular: negative Gastrointestinal: negative Genitourinary:negative Integument/breast: negative Hematologic/lymphatic: negative Musculoskeletal:positive for back pain Neurological: negative Behavioral/Psych: negative Endocrine: negative Allergic/Immunologic: negative   PHYSICAL  EXAMINATION: General appearance: alert, cooperative, fatigued and no distress Head: Normocephalic, without obvious abnormality, atraumatic Neck: no adenopathy, no JVD, supple, symmetrical, trachea midline and thyroid not enlarged, symmetric, no tenderness/mass/nodules Lymph nodes: Cervical, supraclavicular, and axillary nodes normal. Resp: clear to auscultation bilaterally Back: symmetric, no curvature. ROM normal. No CVA tenderness. Cardio: regular rate and rhythm, S1, S2 normal, no murmur, click, rub or gallop GI: soft, non-tender; bowel sounds normal; no masses,  no organomegaly Extremities: extremities normal, atraumatic, no cyanosis or edema Neurologic: Alert and oriented X 3, normal strength and tone. Normal symmetric reflexes. Normal coordination and gait  ECOG PERFORMANCE STATUS: 1 - Symptomatic but completely ambulatory  Blood pressure 130/73, pulse 52, temperature 97.4 F (36.3 C), temperature source Oral, resp. rate 18, height _0  (2.032 m), weight 196 lb 9.6 oz (89.177 kg), SpO2 100 %.  LABORATORY DATA: Lab Results  Component Value Date   WBC 3.6* 08/27/2015   HGB 13.9 08/27/2015   HCT 43.4 08/27/2015   MCV 85.9 08/27/2015   PLT 241 08/27/2015      Chemistry      Component Value Date/Time   NA 140 08/27/2015 1528   K 4.6 08/27/2015 1528   CO2 25 08/27/2015 1528   BUN 18.7 08/27/2015 1528   CREATININE 1.1 08/27/2015 1528      Component Value Date/Time   CALCIUM 10.2 08/27/2015 1528   ALKPHOS 79 08/27/2015 1528   AST 28 08/27/2015 1528   ALT 17 08/27/2015 1528   BILITOT 0.45 08/27/2015 1528       RADIOGRAPHIC STUDIES: Ct Chest W Contrast  09/13/2015  CLINICAL DATA:  RIGHT flank pain and RIGHT upper lobe chest pain. Shortness breath. History of RIGHT lung cancer with bone metastasis. Subsequent treatment evaluation. EXAM: CT CHEST, ABDOMEN, AND PELVIS WITH CONTRAST TECHNIQUE: Multidetector CT imaging of the chest, abdomen and pelvis was performed following  the standard protocol during bolus administration of intravenous contrast. CONTRAST:  130m OMNIPAQUE IOHEXOL 300 MG/ML SOLN, 511mOMNIPAQUE IOHEXOL 300 MG/ML SOLN COMPARISON:  05/01/2015 FINDINGS: CT CHEST FINDINGS Mediastinum/Nodes: No axillary supraclavicular lymphadenopathy. No mediastinal or hilar lymphadenopathy. No pericardial fluid. No central pulmonary embolism. No pericardial fluid. Esophagus normal. Lungs/Pleura: The simple fluid attenuation rounded lesion in the posterior medial aspect of the RIGHT upper lobe abutting the pleural surface measures 5.9 x 5.3 cm not changed from 5.5 x 6.1 cm. There is bullous change in the LEFT and RIGHT upper lobe. Atelectasis in the RIGHT lower lobe. No RIGHT lung nodules. Small nodule in the LEFT lower lobe posterior to the fissure measures 7 mm (Image 49, series 5) not changed in size in comparison exam . Musculoskeletal: No aggressive osseous lesion. CT ABDOMEN AND PELVIS FINDINGS Hepatobiliary: No focal hepatic lesion. No biliary ductal dilatation. Gallbladder is normal. Common bile duct is normal. Pancreas: Pancreas is normal. No ductal dilatation. No pancreatic inflammation. Spleen: Normal spleen Adrenals/urinary tract: Adrenal glands and kidneys unchanged. Nonobstructing calculus in the LEFT kidney. Bilateral renal cortical thinning. Ureters and bladder normal. Stomach/Bowel: Stomach, small bowel, appendix, and cecum are normal. The colon and rectosigmoid colon are normal. Vascular/Lymphatic: Abdominal aorta is normal caliber with atherosclerotic calcification. There is no retroperitoneal or periportal lymphadenopathy. No pelvic lymphadenopathy. Retrocrural adenopathy described on comparison CT is  difficult to define. There is soft tissue thickening on image 66, series 2. Reproductive: Prostate normal. Other: No free fluid. Musculoskeletal: Stable sclerosis of the T12 vertebral body. IMPRESSION: Chest Impression: 1. No evidence of lung cancer progression in the  thorax. 2. Stable fluid density lesion in the medial RIGHT upper lobe. 3. Stable nodule in the LEFT lower lobe. 4. Extensive bullous change at the apices. Abdomen / Pelvis Impression: 1. No metastatic disease in the abdomen or pelvis. 2. Stable sclerotic metastasis at T12. Electronically Signed   By: Suzy Bouchard M.D.   On: 09/13/2015 14:25   Ct Abdomen Pelvis W Contrast  09/13/2015  CLINICAL DATA:  RIGHT flank pain and RIGHT upper lobe chest pain. Shortness breath. History of RIGHT lung cancer with bone metastasis. Subsequent treatment evaluation. EXAM: CT CHEST, ABDOMEN, AND PELVIS WITH CONTRAST TECHNIQUE: Multidetector CT imaging of the chest, abdomen and pelvis was performed following the standard protocol during bolus administration of intravenous contrast. CONTRAST:  126m OMNIPAQUE IOHEXOL 300 MG/ML SOLN, 567mOMNIPAQUE IOHEXOL 300 MG/ML SOLN COMPARISON:  05/01/2015 FINDINGS: CT CHEST FINDINGS Mediastinum/Nodes: No axillary supraclavicular lymphadenopathy. No mediastinal or hilar lymphadenopathy. No pericardial fluid. No central pulmonary embolism. No pericardial fluid. Esophagus normal. Lungs/Pleura: The simple fluid attenuation rounded lesion in the posterior medial aspect of the RIGHT upper lobe abutting the pleural surface measures 5.9 x 5.3 cm not changed from 5.5 x 6.1 cm. There is bullous change in the LEFT and RIGHT upper lobe. Atelectasis in the RIGHT lower lobe. No RIGHT lung nodules. Small nodule in the LEFT lower lobe posterior to the fissure measures 7 mm (Image 49, series 5) not changed in size in comparison exam . Musculoskeletal: No aggressive osseous lesion. CT ABDOMEN AND PELVIS FINDINGS Hepatobiliary: No focal hepatic lesion. No biliary ductal dilatation. Gallbladder is normal. Common bile duct is normal. Pancreas: Pancreas is normal. No ductal dilatation. No pancreatic inflammation. Spleen: Normal spleen Adrenals/urinary tract: Adrenal glands and kidneys unchanged. Nonobstructing  calculus in the LEFT kidney. Bilateral renal cortical thinning. Ureters and bladder normal. Stomach/Bowel: Stomach, small bowel, appendix, and cecum are normal. The colon and rectosigmoid colon are normal. Vascular/Lymphatic: Abdominal aorta is normal caliber with atherosclerotic calcification. There is no retroperitoneal or periportal lymphadenopathy. No pelvic lymphadenopathy. Retrocrural adenopathy described on comparison CT is difficult to define. There is soft tissue thickening on image 66, series 2. Reproductive: Prostate normal. Other: No free fluid. Musculoskeletal: Stable sclerosis of the T12 vertebral body. IMPRESSION: Chest Impression: 1. No evidence of lung cancer progression in the thorax. 2. Stable fluid density lesion in the medial RIGHT upper lobe. 3. Stable nodule in the LEFT lower lobe. 4. Extensive bullous change at the apices. Abdomen / Pelvis Impression: 1. No metastatic disease in the abdomen or pelvis. 2. Stable sclerotic metastasis at T12. Electronically Signed   By: StSuzy Bouchard.D.   On: 09/13/2015 14:25    ASSESSMENT AND PLAN: This is a very pleasant [age]ears old African-American male with metastatic non-small cell lung cancer initially diagnosed as a stage IIIB squamous cell carcinoma but the patient has metastatic disease at the T12 status post decompressive laminectomy and the final pathology was consistent with adenocarcinoma with negative EGFR mutation and negative ALK gene translocation. The recent CT scan of the chest, abdomen and pelvis showed stable disease. I discussed the scan results with the patient today. I recommended for him to continue on observation with repeat CT scan of the chest, abdomen and pelvis in 6 months. For pain management,  he will continue with the current pain medication with MS Contin and Percocet.  He was advised to call immediately if he has any concerning symptoms in the interval. The patient voices understanding of current disease status and  treatment options and is in agreement with the current care plan.  All questions were answered. The patient knows to call the clinic with any problems, questions or concerns. We can certainly see the patient much sooner if necessary.   Disclaimer: This note was dictated with voice recognition software. Similar sounding words can inadvertently be transcribed and may not be corrected upon review.

## 2015-09-26 ENCOUNTER — Telehealth: Payer: Self-pay | Admitting: Internal Medicine

## 2015-09-26 NOTE — Telephone Encounter (Signed)
Left message for patient confirming June appointments and mailed schedule.

## 2015-09-30 IMAGING — CT CT CHEST W/ CM
2 of 5 series · 15 of 46 positions shown, 17 images · IV contrast (OMNIPAQUE)
Comparison: CT chest dated 01/15/2015. CT chest abdomen pelvis
dated 09/20/2014.

CLINICAL DATA: Right lung cancer with bone metastases, status post
chemotherapy and XRT, intermittent shortness of breath, abdominal/
back pain

EXAM:
CT CHEST, ABDOMEN, AND PELVIS WITH CONTRAST
TECHNIQUE: Multidetector CT imaging of the chest, abdomen and pelvis was
performed following the standard protocol during bolus
administration of intravenous contrast.
CONTRAST:  100mL OMNIPAQUE IOHEXOL 300 MG/ML  SOLN

[Series 2: cap with st · axial · 0.83mm/px · z∈[-696,-76]mm · 12 of 142 slices shown, 14 images]
[im 9/142  soft-tissue]
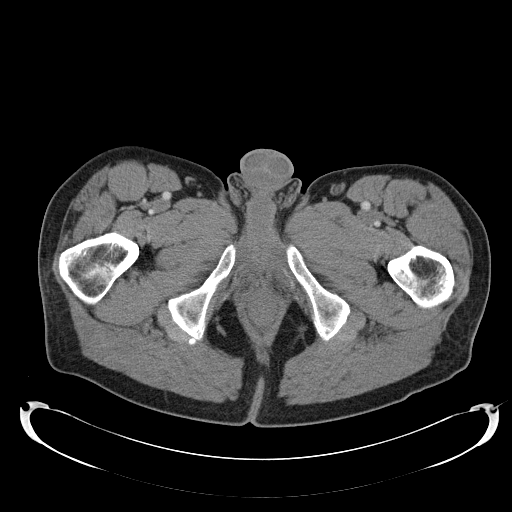
[im 9/142  bone]
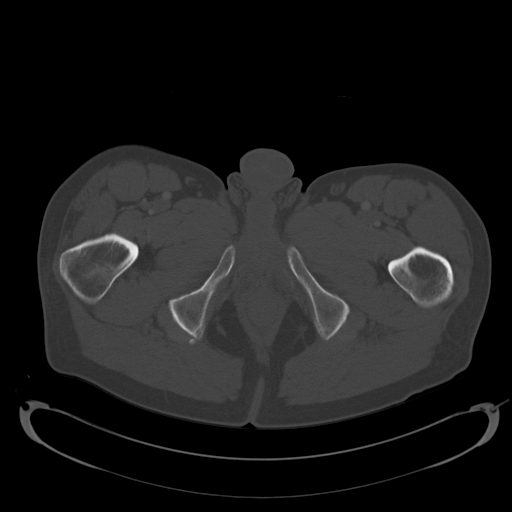
[im 25/142  soft-tissue]
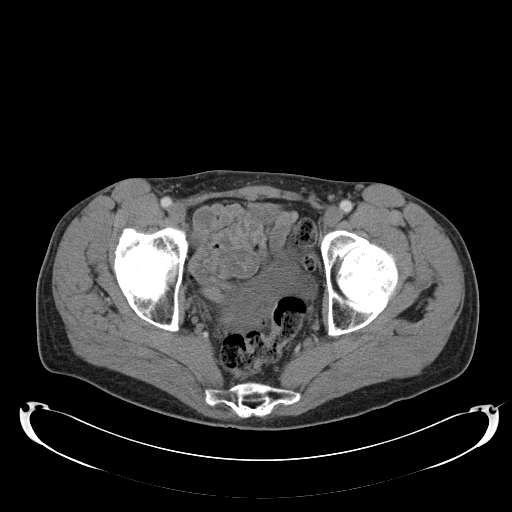
[im 34/142  soft-tissue]
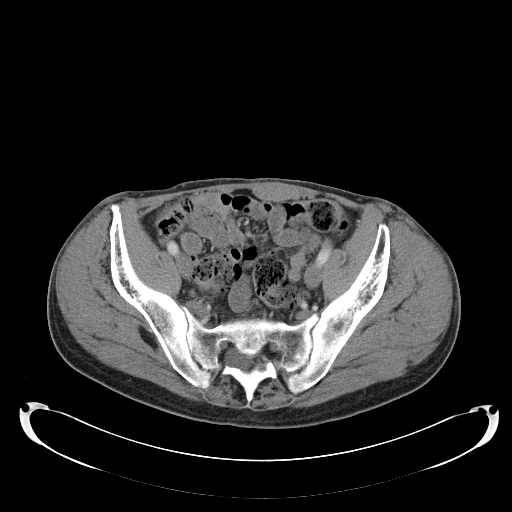
[im 42/142  soft-tissue]
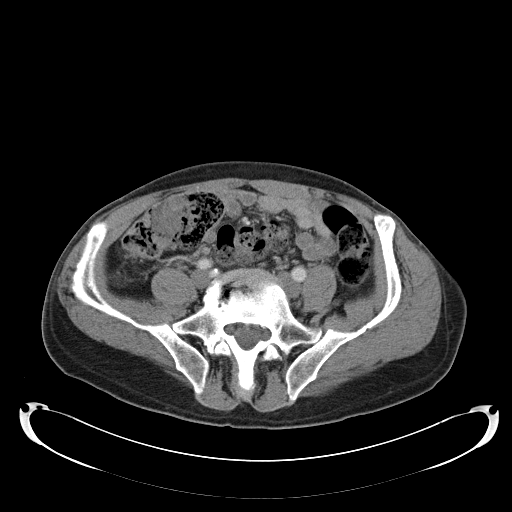
[im 59/142  soft-tissue]
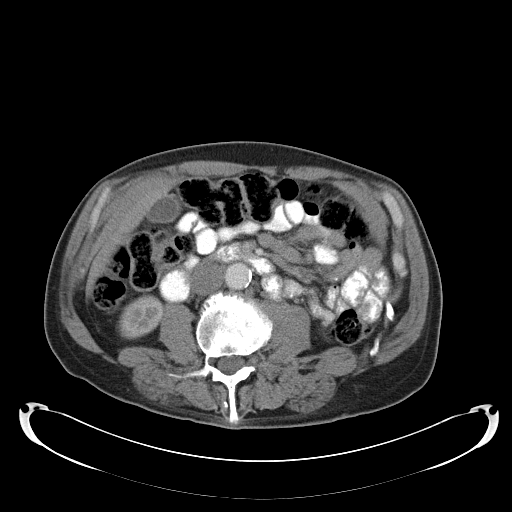
[im 67/142  soft-tissue]
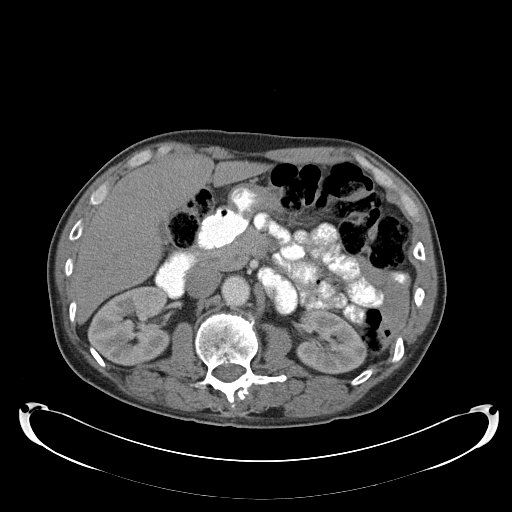
[im 75/142  soft-tissue]
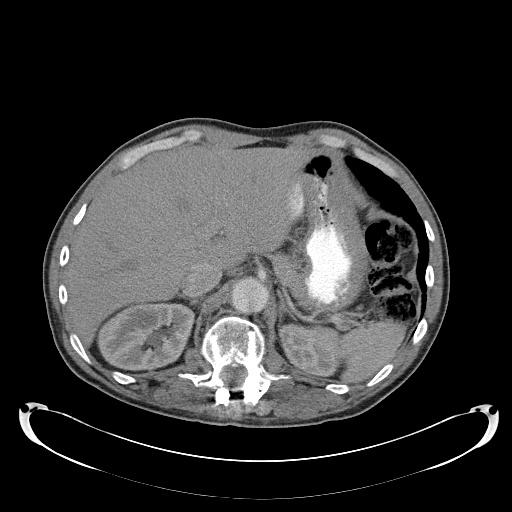
[im 92/142  soft-tissue]
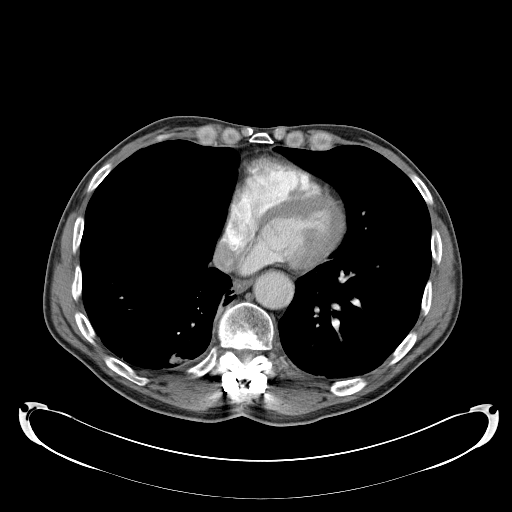
[im 100/142  soft-tissue]
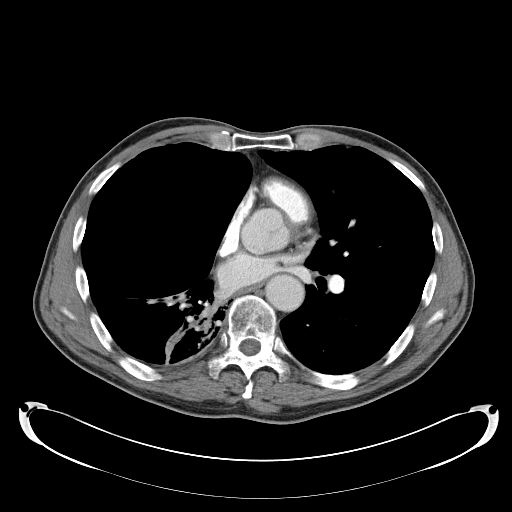
[im 100/142  bone]
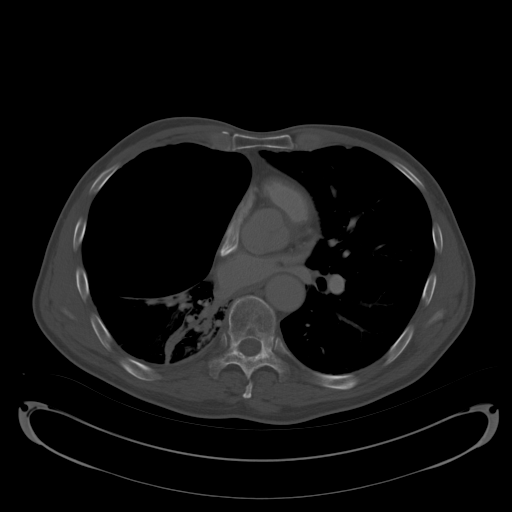
[im 108/142  soft-tissue]
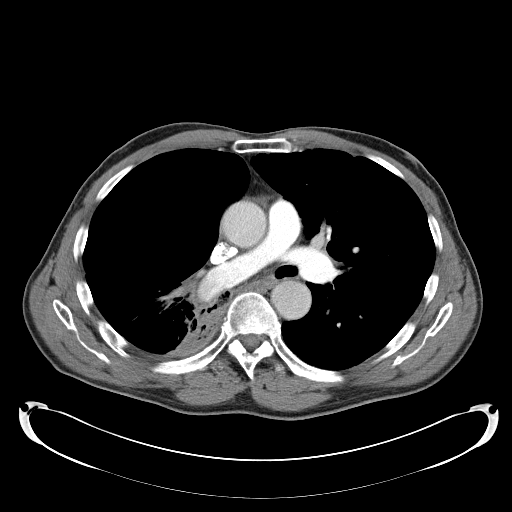
[im 125/142  soft-tissue]
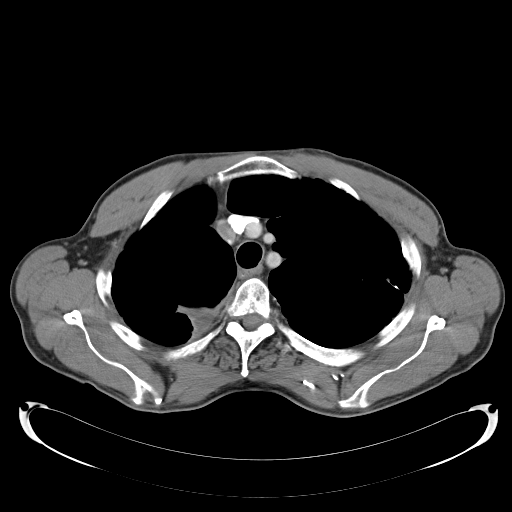
[im 133/142  soft-tissue]
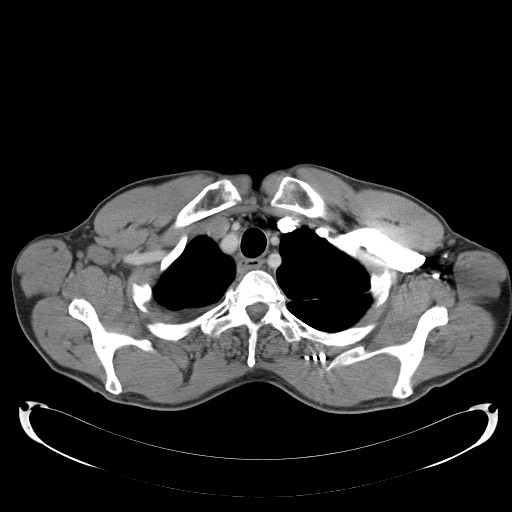

[Series 602: <mpr thick range> · coronal · 1.38mm/px · 3 of 83 slices shown]
[im 28/83  soft-tissue]
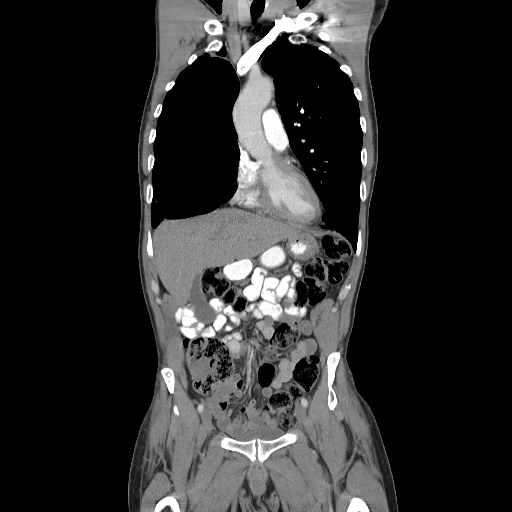
[im 37/83  soft-tissue]
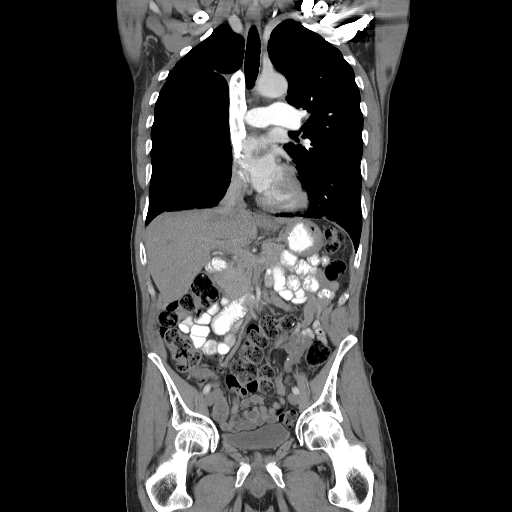
[im 46/83  soft-tissue]
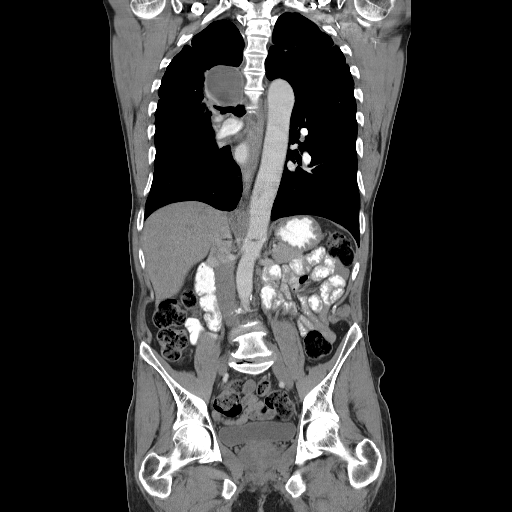

[15 of 46 positions shown; findings below may reference images not displayed]

FINDINGS: CT CHEST FINDINGS

Mediastinum/Nodes: The heart is normal in size. No pericardial
effusion.

No suspicious mediastinal or hilar lymphadenopathy. Small bilateral
axillary lymph nodes, within normal limits.

Again noted is a 1.5 x 2.4 cm right retrocrural node (series 2/image
63), difficult to measure but likely grossly unchanged. Differences
in size from prior studies are favored to be related to differences
in measurement.

Visualized thyroid is unremarkable.

Lungs/Pleura: Moderate to severe emphysematous changes with a
dominant bulla in the anterior right upper lobe. 6.1 x 5.5 cm
fluid-filled lesion in the medial right lung/right perihilar region
(series 2/image 24) which is favored to be a fluid-filled bulla and
is unchanged from prior studies.

Stable mass-like consolidation in the posterior right lower lobe
measuring 6.0 x 2.9 cm (series 2/ image 32), without definite
underlying mass, unchanged and possibly reflecting radiation
changes.

No new/ suspicious pulmonary nodules.

No pleural effusion or pneumothorax.

Musculoskeletal: Sclerotic metastasis involving the T12 vertebral
body, unchanged.

Posterior spinal fixation hardware at T11-L1.

Mild degenerative changes of the thoracic spine.

CT ABDOMEN PELVIS FINDINGS

Hepatobiliary: Liver is within normal limits. No
suspicious/enhancing hepatic lesions.

Gallbladder is unremarkable. No intrahepatic or extrahepatic ductal
dilatation.

Pancreas: Within normal limits.

Spleen: Within normal limits.

Adrenals/Urinary Tract: Adrenal glands within normal limits.

3.2 cm cyst in the interpolar right kidney (series 2/ image 70). 8
mm nonobstructing calculus in the interpolar left kidney (series 2/
image 33). 2 mm nonobstructing right lower pole renal calculus
(series 2/ image 80). No hydronephrosis.

Bladder is within normal limits.

Stomach/Bowel: Stomach is within normal limits.

No evidence of bowel obstruction.

Normal appendix.

Vascular/Lymphatic: Atherosclerotic calcifications of the abdominal
aorta and branch vessels.

No suspicious abdominopelvic lymphadenopathy.

Reproductive: Prostate is unremarkable.

Other: No abdominopelvic ascites.

Musculoskeletal: Fusion at the thoracolumbar junction, as above.

Degenerative changes of the lumbar spine.
IMPRESSION: Stable mass-like consolidation involving the posterior right lower
lobe, possibly reflecting radiation changes, without definite
underlying mass.

Stable 6.1 x 5.5 cm fluid-filled lesion in the medial right
lung/right perihilar region, possibly reflecting a fluid-filled
bulla.

1.5 x 2.4 cm right retrocrural node, possibly reflecting a nodal
metastasis. Please note that this is difficult to discretely measure
but is favored to be unchanged from both prior studies when measured
in a similar fashion.

Osseous metastasis at T12 with associated posterior spinal fixation
at T11-L1.

No evidence of new/progressive metastatic disease.

## 2015-10-07 ENCOUNTER — Telehealth: Payer: Self-pay | Admitting: *Deleted

## 2015-10-07 ENCOUNTER — Other Ambulatory Visit: Payer: Self-pay | Admitting: Medical Oncology

## 2015-10-07 DIAGNOSIS — C3490 Malignant neoplasm of unspecified part of unspecified bronchus or lung: Secondary | ICD-10-CM

## 2015-10-07 MED ORDER — OXYCODONE HCL 5 MG PO TABS: 5.0000 mg | ORAL_TABLET | Freq: Four times a day (QID) | ORAL | Status: DC | PRN

## 2015-10-07 MED ORDER — MORPHINE SULFATE ER 30 MG PO TBCR: 30.0000 mg | EXTENDED_RELEASE_TABLET | Freq: Two times a day (BID) | ORAL | Status: DC

## 2015-10-07 NOTE — Telephone Encounter (Signed)
Herbert Smith called requesting both pain medicine refill request.  Would like morphine 30 mg and oxycodone 5 mg.  Asked for a call when ready for pick up.  Can pick up today.  Call [phone].  Out of morphine but has a couple oxycodone left.

## 2015-10-07 NOTE — Progress Notes (Signed)
rx ready for pickup-left VM to pick up

## 2015-11-13 ENCOUNTER — Encounter (HOSPITAL_COMMUNITY): Payer: Self-pay | Admitting: Emergency Medicine

## 2015-11-13 ENCOUNTER — Emergency Department (HOSPITAL_COMMUNITY)
Admission: EM | Admit: 2015-11-13 | Discharge: 2015-11-14 | Disposition: A | Discharge status: Home / Self Care | Payer: Medicaid Other | Source: Home / Self Care

## 2015-11-13 DIAGNOSIS — M549 Dorsalgia, unspecified: Secondary | ICD-10-CM | POA: Insufficient documentation

## 2015-11-13 DIAGNOSIS — R0789 Other chest pain: Secondary | ICD-10-CM | POA: Diagnosis not present

## 2015-11-13 DIAGNOSIS — F17210 Nicotine dependence, cigarettes, uncomplicated: Secondary | ICD-10-CM | POA: Insufficient documentation

## 2015-11-13 DIAGNOSIS — I951 Orthostatic hypotension: Secondary | ICD-10-CM | POA: Diagnosis not present

## 2015-11-13 DIAGNOSIS — R630 Anorexia: Secondary | ICD-10-CM | POA: Diagnosis not present

## 2015-11-13 DIAGNOSIS — R531 Weakness: Secondary | ICD-10-CM | POA: Insufficient documentation

## 2015-11-13 DIAGNOSIS — R5383 Other fatigue: Secondary | ICD-10-CM

## 2015-11-13 DIAGNOSIS — J0100 Acute maxillary sinusitis, unspecified: Secondary | ICD-10-CM | POA: Diagnosis not present

## 2015-11-13 DIAGNOSIS — R05 Cough: Secondary | ICD-10-CM | POA: Insufficient documentation

## 2015-11-13 DIAGNOSIS — R109 Unspecified abdominal pain: Secondary | ICD-10-CM | POA: Insufficient documentation

## 2015-11-13 DIAGNOSIS — R42 Dizziness and giddiness: Secondary | ICD-10-CM | POA: Diagnosis not present

## 2015-11-13 DIAGNOSIS — R0602 Shortness of breath: Secondary | ICD-10-CM | POA: Diagnosis not present

## 2015-11-13 DIAGNOSIS — Z85118 Personal history of other malignant neoplasm of bronchus and lung: Secondary | ICD-10-CM | POA: Diagnosis not present

## 2015-11-13 DIAGNOSIS — Z79899 Other long term (current) drug therapy: Secondary | ICD-10-CM | POA: Diagnosis not present

## 2015-11-13 DIAGNOSIS — G8929 Other chronic pain: Secondary | ICD-10-CM | POA: Diagnosis not present

## 2015-11-13 DIAGNOSIS — R079 Chest pain, unspecified: Secondary | ICD-10-CM | POA: Diagnosis present

## 2015-11-13 LAB — CBC
HCT: 41.4 % (ref 39.0–52.0)
HEMOGLOBIN: 13.9 g/dL (ref 13.0–17.0)
MCH: 29 pg (ref 26.0–34.0)
MCHC: 33.6 g/dL (ref 30.0–36.0)
MCV: 86.4 fL (ref 78.0–100.0)
PLATELETS: 204 10*3/uL (ref 150–400)
RBC: 4.79 MIL/uL (ref 4.22–5.81)
RDW: 15.4 % (ref 11.5–15.5)
WBC: 13.2 10*3/uL — ABNORMAL HIGH (ref 4.0–10.5)

## 2015-11-13 LAB — COMPREHENSIVE METABOLIC PANEL
ALBUMIN: 3.6 g/dL (ref 3.5–5.0)
ALK PHOS: 64 U/L (ref 38–126)
ALT: 19 U/L (ref 17–63)
AST: 21 U/L (ref 15–41)
Anion gap: 8 (ref 5–15)
BILIRUBIN TOTAL: 0.6 mg/dL (ref 0.3–1.2)
BUN: 19 mg/dL (ref 6–20)
CALCIUM: 9.8 mg/dL (ref 8.9–10.3)
CO2: 25 mmol/L (ref 22–32)
CREATININE: 1.14 mg/dL (ref 0.61–1.24)
Chloride: 103 mmol/L (ref 101–111)
GFR calc Af Amer: >60 mL/min (ref >60)
GFR calc non Af Amer: >60 mL/min (ref >60)
GLUCOSE: 81 mg/dL (ref 65–99)
Potassium: 4.5 mmol/L (ref 3.5–5.1)
SODIUM: 136 mmol/L (ref 135–145)
Total Protein: 7.3 g/dL (ref 6.5–8.1)

## 2015-11-13 LAB — URINALYSIS, ROUTINE W REFLEX MICROSCOPIC
Bilirubin Urine: NEGATIVE
GLUCOSE, UA: NEGATIVE mg/dL
Hgb urine dipstick: NEGATIVE
Ketones, ur: 15 mg/dL — AB
Leukocytes, UA: NEGATIVE
Nitrite: NEGATIVE
PROTEIN: NEGATIVE mg/dL
SPECIFIC GRAVITY, URINE: 1.025 (ref 1.005–1.030)
pH: 5.5 (ref 5.0–8.0)

## 2015-11-13 LAB — LIPASE, BLOOD: LIPASE: 24 U/L (ref 11–51)

## 2015-11-13 MED ORDER — OXYCODONE-ACETAMINOPHEN 5-325 MG PO TABS
1.0000 | ORAL_TABLET | Freq: Once | ORAL | Status: AC
  Administered 2015-11-13: 1 via ORAL

## 2015-11-13 NOTE — ED Notes (Signed)
Pt from home for eval of weakness and generalized body aches x3 days, pt states back pain with flank pain . Pt denies any n/v/d, states productive cough as well. Pt uncomfortable in triage.

## 2015-11-14 ENCOUNTER — Emergency Department (HOSPITAL_COMMUNITY): Payer: Medicaid Other

## 2015-11-14 ENCOUNTER — Encounter (HOSPITAL_COMMUNITY): Payer: Self-pay | Admitting: Emergency Medicine

## 2015-11-14 DIAGNOSIS — R42 Dizziness and giddiness: Secondary | ICD-10-CM | POA: Insufficient documentation

## 2015-11-14 DIAGNOSIS — R0789 Other chest pain: Secondary | ICD-10-CM | POA: Insufficient documentation

## 2015-11-14 DIAGNOSIS — Z85118 Personal history of other malignant neoplasm of bronchus and lung: Secondary | ICD-10-CM | POA: Insufficient documentation

## 2015-11-14 DIAGNOSIS — F17210 Nicotine dependence, cigarettes, uncomplicated: Secondary | ICD-10-CM | POA: Insufficient documentation

## 2015-11-14 DIAGNOSIS — M549 Dorsalgia, unspecified: Secondary | ICD-10-CM | POA: Insufficient documentation

## 2015-11-14 DIAGNOSIS — R109 Unspecified abdominal pain: Secondary | ICD-10-CM | POA: Insufficient documentation

## 2015-11-14 DIAGNOSIS — R0602 Shortness of breath: Secondary | ICD-10-CM | POA: Insufficient documentation

## 2015-11-14 DIAGNOSIS — G8929 Other chronic pain: Secondary | ICD-10-CM | POA: Insufficient documentation

## 2015-11-14 DIAGNOSIS — J0100 Acute maxillary sinusitis, unspecified: Secondary | ICD-10-CM | POA: Insufficient documentation

## 2015-11-14 DIAGNOSIS — I951 Orthostatic hypotension: Secondary | ICD-10-CM | POA: Insufficient documentation

## 2015-11-14 DIAGNOSIS — Z79899 Other long term (current) drug therapy: Secondary | ICD-10-CM | POA: Insufficient documentation

## 2015-11-14 DIAGNOSIS — R630 Anorexia: Secondary | ICD-10-CM | POA: Insufficient documentation

## 2015-11-14 LAB — URINALYSIS, ROUTINE W REFLEX MICROSCOPIC
Bilirubin Urine: NEGATIVE
Glucose, UA: NEGATIVE mg/dL
Ketones, ur: NEGATIVE mg/dL
Leukocytes, UA: NEGATIVE
NITRITE: NEGATIVE
PH: 5.5 (ref 5.0–8.0)
Protein, ur: NEGATIVE mg/dL
SPECIFIC GRAVITY, URINE: 1.023 (ref 1.005–1.030)

## 2015-11-14 LAB — URINE MICROSCOPIC-ADD ON

## 2015-11-14 LAB — BASIC METABOLIC PANEL
Anion gap: 13 (ref 5–15)
BUN: 18 mg/dL (ref 6–20)
CALCIUM: 9.7 mg/dL (ref 8.9–10.3)
CHLORIDE: 97 mmol/L — AB (ref 101–111)
CO2: 23 mmol/L (ref 22–32)
CREATININE: 1.26 mg/dL — AB (ref 0.61–1.24)
GFR calc non Af Amer: >60 mL/min (ref >60)
GLUCOSE: 85 mg/dL (ref 65–99)
Potassium: 3.8 mmol/L (ref 3.5–5.1)
Sodium: 133 mmol/L — ABNORMAL LOW (ref 135–145)

## 2015-11-14 LAB — CBC
HEMATOCRIT: 36.6 % — AB (ref 39.0–52.0)
HEMOGLOBIN: 12.3 g/dL — AB (ref 13.0–17.0)
MCH: 28.1 pg (ref 26.0–34.0)
MCHC: 33.6 g/dL (ref 30.0–36.0)
MCV: 83.6 fL (ref 78.0–100.0)
Platelets: 203 10*3/uL (ref 150–400)
RBC: 4.38 MIL/uL (ref 4.22–5.81)
RDW: 15 % (ref 11.5–15.5)
WBC: 10.8 10*3/uL — ABNORMAL HIGH (ref 4.0–10.5)

## 2015-11-14 LAB — I-STAT TROPONIN, ED: Troponin i, poc: 0 ng/mL (ref 0.00–0.08)

## 2015-11-14 LAB — LIPASE, BLOOD: Lipase: 22 U/L (ref 11–51)

## 2015-11-14 NOTE — ED Notes (Signed)
Patient arrives with complaint of chest pain, dizziness, abdominal pain, back pain, weakness, and fatigue. States he presented for evaluation 2 days ago, but wasn't seen because of extended wait. States that tonight symptoms continue.

## 2015-11-15 ENCOUNTER — Encounter (HOSPITAL_COMMUNITY): Payer: Self-pay

## 2015-11-15 ENCOUNTER — Emergency Department (HOSPITAL_COMMUNITY): Payer: Medicaid Other

## 2015-11-15 ENCOUNTER — Emergency Department (HOSPITAL_COMMUNITY)
Admission: EM | Admit: 2015-11-15 | Discharge: 2015-11-15 | Disposition: A | Discharge status: Home / Self Care | Payer: Medicaid Other | Attending: Emergency Medicine | Admitting: Emergency Medicine

## 2015-11-15 DIAGNOSIS — I951 Orthostatic hypotension: Secondary | ICD-10-CM

## 2015-11-15 DIAGNOSIS — J0100 Acute maxillary sinusitis, unspecified: Secondary | ICD-10-CM

## 2015-11-15 DIAGNOSIS — R0789 Other chest pain: Secondary | ICD-10-CM

## 2015-11-15 LAB — INFLUENZA PANEL BY PCR (TYPE A & B)
H1N1FLUPCR: NOT DETECTED
INFLBPCR: NEGATIVE
Influenza A By PCR: NEGATIVE

## 2015-11-15 LAB — I-STAT TROPONIN, ED: TROPONIN I, POC: 0 ng/mL (ref 0.00–0.08)

## 2015-11-15 MED ORDER — SODIUM CHLORIDE 0.9 % IV BOLUS (SEPSIS)
1000.0000 mL | Freq: Once | INTRAVENOUS | Status: AC
  Administered 2015-11-15: 1000 mL via INTRAVENOUS

## 2015-11-15 MED ORDER — LEVOFLOXACIN 500 MG PO TABS: 500.0000 mg | ORAL_TABLET | Freq: Every day | ORAL | Status: DC

## 2015-11-15 MED ORDER — ACETAMINOPHEN 500 MG PO TABS
1000.0000 mg | ORAL_TABLET | Freq: Once | ORAL | Status: AC
  Administered 2015-11-15: 1000 mg via ORAL
  Filled 2015-11-15: qty 2

## 2015-11-15 MED ORDER — OXYMETAZOLINE HCL 0.05 % NA SOLN: 1.0000 | Freq: Two times a day (BID) | NASAL | Status: DC

## 2015-11-15 MED ORDER — LORATADINE 10 MG PO TABS: 10.0000 mg | ORAL_TABLET | Freq: Every day | ORAL | Status: DC

## 2015-11-15 MED ORDER — IOHEXOL 350 MG/ML SOLN
80.0000 mL | Freq: Once | INTRAVENOUS | Status: AC | PRN
  Administered 2015-11-15: 80 mL via INTRAVENOUS

## 2015-11-15 MED ORDER — LORAZEPAM 2 MG/ML IJ SOLN
INTRAMUSCULAR | Status: AC
  Filled 2015-11-15: qty 1

## 2015-11-15 MED ORDER — LORAZEPAM 2 MG/ML IJ SOLN
1.0000 mg | Freq: Once | INTRAMUSCULAR | Status: AC
  Administered 2015-11-15: 1 mg via INTRAVENOUS

## 2015-11-15 NOTE — ED Provider Notes (Signed)
I was asked to see the patient regarding fever prior to the patient being discharged.  The patient hasn't come in over the evening with complaints of weakness for the last week, some lightheadedness with standing and diffuse body aches. There has been a productive cough with some sputum. While the patient was in the emergency department he had a workup including troponin, cooperative metabolic panel, second troponin, CBC, urinalysis and lipase. There is no definite etiology as the patient was not anemic, he did not have a leukocytosis, thus a chest x-ray and a CT angiogram ordered of the patient's chest neither of which showed any significant findings concerning for acute pathology though he did have a slightly enlarged thoracic aorta. There were some bullous findings in the lungs consistent with lung disease. Before the patient was discharged he had developed a fever over 102, of note the patient was given Ativan at 620, after that he did have some difficulty walking to the bathroom and appeared excessively sleepy. On my exam the patient has a normal mental status, answers my questions appropriately, has normal coordination, normal strength in all 4 extremities, has normal speech, normal memory and his heart and lungs are normal. He has a supple neck without any stiffness with full range of motion, no trismus or torticollis, moist mucous membranes, clear conjunctiva, soft abdomen, soft compartments. The patient has been informed of all of his results, flu swab is pending, the patient will eat, ambulate and will call family to come pick the patient. I doubt that there is a more significant process going on however I would like to know his flu status prior to discharge.  Noemi Chapel, MD 11/15/15 [phone]

## 2015-11-15 NOTE — ED Notes (Signed)
Patient transported to CT scan . 

## 2015-11-15 NOTE — ED Notes (Signed)
Pt. given Ativan IV at CT scan .

## 2015-11-15 NOTE — ED Notes (Signed)
Pt able to ambulate without difficulty.  Alert and oriented.

## 2015-11-15 NOTE — ED Provider Notes (Signed)
CSN: [id number]     Arrival date & time 11/14/15  2051 History  By signing my name below, I, Evelene Croon, attest that this documentation has been prepared under the direction and in the presence of Julianne Rice, MD . Electronically Signed: Evelene Croon, Scribe. 11/15/2015. 1:53 AM.     Chief Complaint  Patient presents with  . Chest Pain  . Dizziness  . Back Pain   The history is provided by the patient. No language interpreter was used.    HPI Comments:  Herbert Smith is a 60 y.o. male who presents to the Emergency Department complaining of generalized weakness x 1 week. He reports associated lightheadedness when standing, diffuse myalgias, productive cough with green sputum , chronic back pain, right ear pain, and intermittent cramping left sided CP. He states he does not have CP at this time.  Pt also notes decreased PO intake. He denies fever, nausea, vomiting, and diarrhea. No alleviating factors noted.   Past Medical History  Diagnosis Date  . Cancer ([place])     lung ca dx'd 2011  . Primary cancer of right upper lobe of lung ([place]) 09/12/2014   History reviewed. No pertinent past surgical history. History reviewed. No pertinent family history. Social History  Substance Use Topics  . Smoking status: Current Every Day Smoker -- 1.50 packs/day for 15 years    Types: Cigarettes  . Smokeless tobacco: Never Used  . Alcohol Use: No    Review of Systems  Constitutional: Positive for appetite change and fatigue. Negative for fever.  HENT: Positive for ear pain and sinus pressure. Negative for sore throat.   Respiratory: Positive for cough and shortness of breath. Negative for chest tightness and wheezing.   Cardiovascular: Positive for chest pain. Negative for palpitations and leg swelling.  Gastrointestinal: Positive for abdominal pain. Negative for nausea, vomiting, diarrhea and constipation.  Musculoskeletal: Positive for back pain. Negative for neck pain and neck stiffness.   Skin: Negative for rash and wound.  Neurological: Positive for dizziness, weakness (generalized) and light-headedness. Negative for numbness and headaches.  All other systems reviewed and are negative.  Allergies  Fentanyl  Home Medications   Prior to Admission medications   Medication Sig Start Date End Date Taking? Authorizing Provider  albuterol (PROVENTIL HFA;VENTOLIN HFA) 108 (90 BASE) MCG/ACT inhaler Inhale 2 puffs into the lungs every 6 (six) hours as needed for wheezing or shortness of breath.   Yes Historical Provider, MD  morphine (MS CONTIN) 30 MG 12 hr tablet Take 1 tablet (30 mg total) by mouth every 12 (twelve) hours. 10/07/15  Yes Curt Bears, MD  oxyCODONE (OXY IR/ROXICODONE) 5 MG immediate release tablet Take 1 tablet (5 mg total) by mouth every 6 (six) hours as needed for severe pain. 10/07/15  Yes Curt Bears, MD  tiotropium (SPIRIVA) 18 MCG inhalation capsule Place 18 mcg into inhaler and inhale 2 (two) times daily.   Yes Historical Provider, MD  levofloxacin (LEVAQUIN) 500 MG tablet Take 1 tablet (500 mg total) by mouth daily. 11/15/15   Julianne Rice, MD  loratadine (CLARITIN) 10 MG tablet Take 1 tablet (10 mg total) by mouth daily. 11/15/15   Julianne Rice, MD  oxymetazoline (AFRIN NASAL SPRAY) 0.05 % nasal spray Place 1 spray into both nostrils 2 (two) times daily. 11/15/15   Julianne Rice, MD   BP 116/72 mmHg  Pulse 87  Temp(Src) 98.9 F (37.2 C) (Oral)  Resp 16  Ht '6\' 8"'$  (2.032 m)  Wt 189 lb (85.73  kg)  BMI 20.76 kg/m2  SpO2 97% Physical Exam  Constitutional: He is oriented to person, place, and time. He appears well-developed and well-nourished. No distress.  HENT:  Head: Normocephalic and atraumatic.  Mouth/Throat: Oropharynx is clear and moist.  Patient has tenderness to palpation over his right maxillary sinus. There is bilateral TM bulging  Eyes: EOM are normal. Pupils are equal, round, and reactive to light.  Neck: Normal range of motion.  Neck supple. No JVD present.  Cardiovascular: Normal rate and regular rhythm.  Exam reveals no gallop and no friction rub.   No murmur heard. Pulmonary/Chest: Effort normal and breath sounds normal. No respiratory distress. He has no wheezes. He has no rales. He exhibits no tenderness.  Abdominal: Soft. Bowel sounds are normal. He exhibits no distension and no mass. There is no tenderness. There is no rebound and no guarding.  Musculoskeletal: Normal range of motion. He exhibits no edema or tenderness.  Diffuse thoracic and lumbar muscular pain. No lower extremity asymmetry or calf tenderness. Distal pulses are equal.  Neurological: He is alert and oriented to person, place, and time.  Patient is alert and oriented x3 with clear, goal oriented speech. Patient has 5/5 motor in all extremities. Sensation is intact to light touch.  Skin: Skin is warm and dry. No rash noted. No erythema.  Psychiatric: He has a normal mood and affect. His behavior is normal.  Nursing note and vitals reviewed.   ED Course  Procedures   DIAGNOSTIC STUDIES:  Oxygen Saturation is 97% on RA, normal by my interpretation.    COORDINATION OF CARE:  1:30 AM Discussed treatment plan with pt at bedside and pt agreed to plan.  Labs Review Labs Reviewed  BASIC METABOLIC PANEL - Abnormal; Notable for the following:    Sodium 133 (*)    Chloride 97 (*)    Creatinine, Ser 1.26 (*)    All other components within normal limits  CBC - Abnormal; Notable for the following:    WBC 10.8 (*)    Hemoglobin 12.3 (*)    HCT 36.6 (*)    All other components within normal limits  URINALYSIS, ROUTINE W REFLEX MICROSCOPIC (NOT AT [facility]) - Abnormal; Notable for the following:    Color, Urine AMBER (*)    Hgb urine dipstick SMALL (*)    All other components within normal limits  URINE MICROSCOPIC-ADD ON - Abnormal; Notable for the following:    Squamous Epithelial / LPF 0-5 (*)    Bacteria, UA RARE (*)    All other components  within normal limits  LIPASE, BLOOD  I-STAT TROPOININ, ED  I-STAT TROPOININ, ED  I-STAT TROPOININ, ED    Imaging Review Dg Chest 2 View  11/14/2015  CLINICAL DATA:  Chest pain, dizziness, abdominal pain, back pain, weakness and fatigue. EXAM: CHEST  2 VIEW COMPARISON:  Most recent chest imaging 09/13/2015 FINDINGS: Advanced emphysema with large bulla throughout the right hemithorax. Rounded right paravertebral density corresponding to fluid-filled structure on CT, unchanged dating back to 2015. Cardiomediastinal contours are unchanged. The left lower lobe nodule on prior CT is not well seen radiographically. No consolidation, pulmonary edema, or pleural effusion. Sclerosis again noted within T12 vertebral body. T11 through L1 fixation hardware is seen. IMPRESSION: No evidence of acute process. Advanced bullous emphysema with large bulla throughout the right lung. Rounded right lung paravertebral structure is chronic and unchanged from prior exams. Electronically Signed   By: Jeb Levering M.D.   On: 11/14/2015 22:04  Ct Angio Chest Pe W/cm &/or Wo Cm  11/15/2015  CLINICAL DATA:  Shortness of breath and chest pain. History of lung carcinoma EXAM: CT ANGIOGRAPHY CHEST WITH CONTRAST TECHNIQUE: Multidetector CT imaging of the chest was performed using the standard protocol during bolus administration of intravenous contrast. Multiplanar CT image reconstructions and MIPs were obtained to evaluate the vascular anatomy. CONTRAST:  52m OMNIPAQUE IOHEXOL 350 MG/ML SOLN COMPARISON:  Chest CT September 13, 2015; chest radiograph November 14, 2015 FINDINGS: Mediastinum/Lymph Nodes: The contrast bolus is slightly less than optimal. There is no demonstrable pulmonary embolus with admitted limitation for detection of pulmonary emboli at the subsegmental level. There is prominence of the central pulmonary arteries with fairly rapid peripheral tapering suggesting underlying pulmonary arterial hypertension. There is  prominence of the ascending thoracic aorta with a maximum transverse diameter of 4.2 x 4.0 cm. There is no thoracic aortic dissection. The visualized great vessels appear unremarkable. The pericardium is minimally thickened. Thyroid appears unremarkable. There is no demonstrable mediastinal or hilar adenopathy. Lungs/Pleura: There is a bulla which occupies most of the right upper lobe. There is a persistent fluid-filled structure surrounded by nonspecific atelectatic change measuring 5.6 x 5.6 cm, essentially stable. There is nearby consolidation in the superior and medial segments of the right lower lobe, essentially stable from prior study. There is also extensive bullous disease in the left upper lobe, stable. In comparison with the recent prior study, a 7 mm nodular opacity at the level of the left major fissure peripherally measures 7 mm, stable. There is patchy atelectasis in the left upper lobe anteriorly, increased from recent prior study. There is a stable opacity abutting the pleura in the anterior segment of the left upper lobe measuring 8 mm. There is mild atelectasis in the posterior left base as well. Upper abdomen: In the visualized upper abdomen, there is a cyst in the anterior right kidney measuring 3.6 x 3.0 cm. There is postoperative change in the upper lumbar spine. There is persistent retrocrural adenopathy on the right with the largest lymph node measuring 2.5 x 2.7 cm, slightly below the thoracoabdominal junction. Musculoskeletal: There is extensive sclerotic metastatic disease in the T12 vertebral body with extension into the posterior elements bilaterally at this level. No new bony metastases identified. Review of the MIP images confirms the above findings. IMPRESSION: No demonstrable pulmonary embolus. Prominence of the ascending thoracic aorta. Maximum transverse diameter measured 4.2 x 4.0 cm. Recommend annual imaging followup by CTA or MRA. This recommendation follows 2010  ACCF/AHA/AATS/ACR/ASA/SCA/SCAI/SIR/STS/SVM Guidelines for the Diagnosis and Management of Patients with Thoracic Aortic Disease. Circulation. 2010; 121: [id number] bullous disease, primarily in the upper lobes. Persistent fluid filled cystic appearing structure in the right upper lobe surrounded by nonspecific atelectasis. Persistent airspace consolidation superior and medial segments right lower lobe. Patchy atelectasis elsewhere. Small nodular lesions on the left stable. Right retrocrural adenopathy, stable.  No new adenopathy apparent. Focal sclerotic bony metastatic disease involving the T12 vertebral body and posterior elements at this level. Electronically Signed   By: WLowella GripIII M.D.   On: 11/15/2015 07:13   I have personally reviewed and evaluated these images and lab results as part of my medical decision-making.   EKG Interpretation   Date/Time:  Thursday November 14 2015 20:55:12 EST Ventricular Rate:  80 PR Interval:  162 QRS Duration: 98 QT Interval:  364 QTC Calculation: 419 R Axis:   89 Text Interpretation:  Normal sinus rhythm Right atrial enlargement  Borderline ECG Confirmed  by Lita Mains  MD, Dawanna Grauberger ([id number]) on 11/15/2015  1:37:32 AM      MDM   Final diagnoses:  Acute maxillary sinusitis, recurrence not specified  Atypical chest pain  Orthostasis    I personally performed the services described in this documentation, which was scribed in my presence. The recorded information has been reviewed and is accurate.   Patient's chest pain appears atypical and described as a cramping sensation. He has troponin 2 which are normal. There is no evidence of ischemia on his EKG. Patient does have a history of lung cancer. He had desaturation to the high 80s in emergency department when resting. There is some concern for possible PE and will get CT angio to rule this out. Patient also likely has right maxillary sinus disease. Straight orthostasis with mild  elevation in creatinine. Given IV fluids in the emergency department.  Patient is resting comfortably. Maintaining saturations greater than 90%. Blood pressure and heart rate are normal. Symptoms improved after IV fluids. Repeat CT scan without evidence of PE. Patient does have mild dilation of the ascending aorta. Angina as needed follow-up with his primary physician regarding ongoing observation. There is persistent atelectasis around the right lung mass. No doubt pneumonia will start on Levaquin which will cover sinus infection as well as possible community acquired pneumonia. Return precautions given.  Julianne Rice, MD 11/15/15 [phone]

## 2015-11-15 NOTE — Discharge Instructions (Signed)
Nonspecific Chest Pain  Chest pain can be caused by many different conditions. There is always a chance that your pain could be related to something serious, such as a heart attack or a blood clot in your lungs. Chest pain can also be caused by conditions that are not life-threatening. If you have chest pain, it is very important to follow up with your health care provider. CAUSES  Chest pain can be caused by:  Heartburn.  Pneumonia or bronchitis.  Anxiety or stress.  Inflammation around your heart (pericarditis) or lung (pleuritis or pleurisy).  A blood clot in your lung.  A collapsed lung (pneumothorax). It can develop suddenly on its own (spontaneous pneumothorax) or from trauma to the chest.  Shingles infection (varicella-zoster virus).  Heart attack.  Damage to the bones, muscles, and cartilage that make up your chest wall. This can include:  Bruised bones due to injury.  Strained muscles or cartilage due to frequent or repeated coughing or overwork.  Fracture to one or more ribs.  Sore cartilage due to inflammation (costochondritis). RISK FACTORS  Risk factors for chest pain may include:  Activities that increase your risk for trauma or injury to your chest.  Respiratory infections or conditions that cause frequent coughing.  Medical conditions or overeating that can cause heartburn.  Heart disease or family history of heart disease.  Conditions or health behaviors that increase your risk of developing a blood clot.  Having had chicken pox (varicella zoster). SIGNS AND SYMPTOMS Chest pain can feel like:  Burning or tingling on the surface of your chest or deep in your chest.  Crushing, pressure, aching, or squeezing pain.  Dull or sharp pain that is worse when you move, cough, or take a deep breath.  Pain that is also felt in your back, neck, shoulder, or arm, or pain that spreads to any of these areas. Your chest pain may come and go, or it may stay  constant. DIAGNOSIS Lab tests or other studies may be needed to find the cause of your pain. Your health care provider may have you take a test called an ambulatory ECG (electrocardiogram). An ECG records your heartbeat patterns at the time the test is performed. You may also have other tests, such as:  Transthoracic echocardiogram (TTE). During echocardiography, sound waves are used to create a picture of all of the heart structures and to look at how blood flows through your heart.  Transesophageal echocardiogram (TEE).This is a more advanced imaging test that obtains images from inside your body. It allows your health care provider to see your heart in finer detail.  Cardiac monitoring. This allows your health care provider to monitor your heart rate and rhythm in real time.  Holter monitor. This is a portable device that records your heartbeat and can help to diagnose abnormal heartbeats. It allows your health care provider to track your heart activity for several days, if needed.  Stress tests. These can be done through exercise or by taking medicine that makes your heart beat more quickly.  Blood tests.  Imaging tests. TREATMENT  Your treatment depends on what is causing your chest pain. Treatment may include:  Medicines. These may include:  Acid blockers for heartburn.  Anti-inflammatory medicine.  Pain medicine for inflammatory conditions.  Antibiotic medicine, if an infection is present.  Medicines to dissolve blood clots.  Medicines to treat coronary artery disease.  Supportive care for conditions that do not require medicines. This may include:  Resting.  Applying heat  or cold packs to injured areas.  Limiting activities until pain decreases. HOME CARE INSTRUCTIONS  If you were prescribed an antibiotic medicine, finish it all even if you start to feel better.  Avoid any activities that bring on chest pain.  Do not use any tobacco products, including  cigarettes, chewing tobacco, or electronic cigarettes. If you need help quitting, ask your health care provider.  Do not drink alcohol.  Take medicines only as directed by your health care provider.  Keep all follow-up visits as directed by your health care provider. This is important. This includes any further testing if your chest pain does not go away.  If heartburn is the cause for your chest pain, you may be told to keep your head raised (elevated) while sleeping. This reduces the chance that acid will go from your stomach into your esophagus.  Make lifestyle changes as directed by your health care provider. These may include:  Getting regular exercise. Ask your health care provider to suggest some activities that are safe for you.  Eating a heart-healthy diet. A registered dietitian can help you to learn healthy eating options.  Maintaining a healthy weight.  Managing diabetes, if necessary.  Reducing stress. SEEK MEDICAL CARE IF:  Your chest pain does not go away after treatment.  You have a rash with blisters on your chest.  You have a fever. SEEK IMMEDIATE MEDICAL CARE IF:   Your chest pain is worse.  You have an increasing cough, or you cough up blood.  You have severe abdominal pain.  You have severe weakness.  You faint.  You have chills.  You have sudden, unexplained chest discomfort.  You have sudden, unexplained discomfort in your arms, back, neck, or jaw.  You have shortness of breath at any time.  You suddenly start to sweat, or your skin gets clammy.  You feel nauseous or you vomit.  You suddenly feel light-headed or dizzy.  Your heart begins to beat quickly, or it feels like it is skipping beats. These symptoms may represent a serious problem that is an emergency. Do not wait to see if the symptoms will go away. Get medical help right away. Call your local emergency services (911 in the [place] Do not drive yourself to the hospital.   This  information is not intended to replace advice given to you by your health care provider. Make sure you discuss any questions you have with your health care provider.   Document Released: 06/17/2005 Document Revised: 09/28/2014 Document Reviewed: 04/13/2014 Elsevier Interactive Patient Education 2016 [employer]  Orthostatic Hypotension Orthostatic hypotension is a sudden drop in blood pressure. It happens when you quickly stand up from a seated or lying position. You may feel dizzy or light-headed. This can last for just a few seconds or for up to a few minutes. It is usually not a serious problem. However, if this happens frequently or gets worse, it can be a sign of something more serious. CAUSES  Different things can cause orthostatic hypotension, including:   Loss of body fluids (dehydration).  Medicines that lower blood pressure.  Sudden changes in posture, such as standing up quickly after you have been sitting or lying down.  Taking too much of your medicine. SIGNS AND SYMPTOMS   Light-headedness or dizziness.   Fainting or near-fainting.   A fast heart rate.   Weakness.   Feeling tired (fatigue).  DIAGNOSIS  Your health care provider may do several things to help diagnose your condition  and identify the cause. These may include:   Taking a medical history and doing a physical exam.  Checking your blood pressure. Your health care provider will check your blood pressure when you are:  Lying down.  Sitting.  Standing.  Using tilt table testing. In this test, you lie down on a table that moves from a lying position to a standing position. You will be strapped onto the table. This test monitors your blood pressure and heart rate when you are in different positions. TREATMENT  Treatment will vary depending on the cause. Possible treatments include:   Changing the dosage of your medicines.  Wearing compression stockings on your lower legs.  Standing up  slowly after sitting or lying down.  Eating more salt.  Eating frequent, small meals.  In some cases, getting IV fluids.  Taking medicine to enhance fluid retention. HOME CARE INSTRUCTIONS  Only take over-the-counter or prescription medicines as directed by your health care provider.  Follow your health care provider's instructions for changing the dosage of your current medicines.  Do not stop or adjust your medicine on your own.  Stand up slowly after sitting or lying down. This allows your body to adjust to the different position.  Wear compression stockings as directed.  Eat extra salt as directed.  Do not add extra salt to your diet unless directed to by your health care provider.  Eat frequent, small meals.  Avoid standing suddenly after eating.  Avoid hot showers or excessive heat as directed by your health care provider.  Keep all follow-up appointments. SEEK MEDICAL CARE IF:  You continue to feel dizzy or light-headed after standing.  You feel groggy or confused.  You feel cold, clammy, or sick to your stomach (nauseous).  You have blurred vision.  You feel short of breath. SEEK IMMEDIATE MEDICAL CARE IF:   You faint after standing.  You have chest pain.  You have difficulty breathing.   You lose feeling or movement in your arms or legs.   You have slurred speech or difficulty talking, or you are unable to talk.  MAKE SURE YOU:   Understand these instructions.  Will watch your condition.  Will get help right away if you are not doing well or get worse.   This information is not intended to replace advice given to you by your health care provider. Make sure you discuss any questions you have with your health care provider.   Document Released: 08/28/2002 Document Revised: 09/12/2013 Document Reviewed: 06/30/2013 Elsevier Interactive Patient Education 2016 Else[employer] Inc[employer]  Sinusitis, Adult Sinusitis is redness, soreness, and inflammation  of the paranasal sinuses. Paranasal sinuses are air pockets within the bones of your face. They are located beneath your eyes, in the middle of your forehead, and above your eyes. In healthy paranasal sinuses, mucus is able to drain out, and air is able to circulate through them by way of your nose. However, when your paranasal sinuses are inflamed, mucus and air can become trapped. This can allow bacteria and other germs to grow and cause infection. Sinusitis can develop quickly and last only a short time (acute) or continue over a long period (chronic). Sinusitis that lasts for more than 12 weeks is considered chronic. CAUSES Causes of sinusitis include:  Allergies.  Structural abnormalities, such as displacement of the cartilage that separates your nostrils (deviated septum), which can decrease the air flow through your nose and sinuses and affect sinus drainage.  Functional abnormalities, such as when the  small hairs (cilia) that line your sinuses and help remove mucus do not work properly or are not present. SIGNS AND SYMPTOMS Symptoms of acute and chronic sinusitis are the same. The primary symptoms are pain and pressure around the affected sinuses. Other symptoms include:  Upper toothache.  Earache.  Headache.  Bad breath.  Decreased sense of smell and taste.  A cough, which worsens when you are lying flat.  Fatigue.  Fever.  Thick drainage from your nose, which often is green and may contain pus (purulent).  Swelling and warmth over the affected sinuses. DIAGNOSIS Your health care provider will perform a physical exam. During your exam, your health care provider may perform any of the following to help determine if you have acute sinusitis or chronic sinusitis:  Look in your nose for signs of abnormal growths in your nostrils (nasal polyps).  Tap over the affected sinus to check for signs of infection.  View the inside of your sinuses using an imaging device that has a  light attached (endoscope). If your health care provider suspects that you have chronic sinusitis, one or more of the following tests may be recommended:  Allergy tests.  Nasal culture. A sample of mucus is taken from your nose, sent to a lab, and screened for bacteria.  Nasal cytology. A sample of mucus is taken from your nose and examined by your health care provider to determine if your sinusitis is related to an allergy. TREATMENT Most cases of acute sinusitis are related to a viral infection and will resolve on their own within 10 days. Sometimes, medicines are prescribed to help relieve symptoms of both acute and chronic sinusitis. These may include pain medicines, decongestants, nasal steroid sprays, or saline sprays. However, for sinusitis related to a bacterial infection, your health care provider will prescribe antibiotic medicines. These are medicines that will help kill the bacteria causing the infection. Rarely, sinusitis is caused by a fungal infection. In these cases, your health care provider will prescribe antifungal medicine. For some cases of chronic sinusitis, surgery is needed. Generally, these are cases in which sinusitis recurs more than 3 times per year, despite other treatments. HOME CARE INSTRUCTIONS  Drink plenty of water. Water helps thin the mucus so your sinuses can drain more easily.  Use a humidifier.  Inhale steam 3-4 times a day (for example, sit in the bathroom with the shower running).  Apply a warm, moist washcloth to your face 3-4 times a day, or as directed by your health care provider.  Use saline nasal sprays to help moisten and clean your sinuses.  Take medicines only as directed by your health care provider.  If you were prescribed either an antibiotic or antifungal medicine, finish it all even if you start to feel better. SEEK IMMEDIATE MEDICAL CARE IF:  You have increasing pain or severe headaches.  You have nausea, vomiting, or  drowsiness.  You have swelling around your face.  You have vision problems.  You have a stiff neck.  You have difficulty breathing.   This information is not intended to replace advice given to you by your health care provider. Make sure you discuss any questions you have with your health care provider.   Document Released: 09/07/2005 Document Revised: 09/28/2014 Document Reviewed: 09/22/2011 Elsevier Interactive Patient Education [employer].

## 2015-11-15 NOTE — ED Notes (Signed)
Pt was assisted to the bathroom and almost fell.  Discharge temp was 102.3.  Dr Sabra Heck notified and stated to not discharge pt at this time.

## 2015-11-25 ENCOUNTER — Telehealth: Payer: Self-pay | Admitting: *Deleted

## 2015-11-25 DIAGNOSIS — C3490 Malignant neoplasm of unspecified part of unspecified bronchus or lung: Secondary | ICD-10-CM

## 2015-11-25 MED ORDER — MORPHINE SULFATE ER 30 MG PO TBCR: 30.0000 mg | EXTENDED_RELEASE_TABLET | Freq: Two times a day (BID) | ORAL | Status: DC

## 2015-11-25 MED ORDER — OXYCODONE HCL 5 MG PO TABS: 5.0000 mg | ORAL_TABLET | Freq: Four times a day (QID) | ORAL | Status: DC | PRN

## 2015-11-25 NOTE — Telephone Encounter (Signed)
Rx signed by MD and ready for pick up. Pt notified

## 2015-11-25 NOTE — Telephone Encounter (Signed)
Called patient after receipt of voicemail: "I need refill on my medication."  "I need both Oxycodone and MS Contin." Both last ordered 10-07-2015.  Will notify Dr. Julien Nordmann.

## 2015-12-31 ENCOUNTER — Telehealth: Payer: Self-pay | Admitting: *Deleted

## 2015-12-31 NOTE — Telephone Encounter (Signed)
TC from patient requesting refill on his pain meds.  However during conversation with this Probation officer, pt reveled his has been itching quite a lot with the Morphine Extended Release and has not been taking it as often because of the itching. Therefore he has been taking more of the oxycodone 5 mg tabs and is out of those.  He has been taking 6/day of oxycodone. He also states these make him very constipated. He states his last BM was last week. Instructed pt to get OTC Senna S and take 2 tonight and 2 in the morning. If not results then continue with 2-4 tabs / day. If no relief  In the next day or two to call us back. Pt also has miralax at home but has not been taking it. Advised to resume that daily and then to back off on senna once he has established an improved bowel pattern.  So patient needs refill on oxycodone but may need something else instead of Morphine for long acting pain relief. Call patient when prescriptions are available for pick up.

## 2016-01-01 ENCOUNTER — Telehealth: Payer: Self-pay | Admitting: *Deleted

## 2016-01-01 DIAGNOSIS — C3490 Malignant neoplasm of unspecified part of unspecified bronchus or lung: Secondary | ICD-10-CM

## 2016-01-01 MED ORDER — TRAMADOL HCL 50 MG PO TABS: 50.0000 mg | ORAL_TABLET | Freq: Four times a day (QID) | ORAL | Status: DC | PRN

## 2016-01-01 MED ORDER — OXYCODONE HCL 5 MG PO TABS: 5.0000 mg | ORAL_TABLET | Freq: Four times a day (QID) | ORAL | Status: DC | PRN

## 2016-01-01 NOTE — Telephone Encounter (Signed)
Called pt regarding itching and refill on pain meds. Pt advised his Morphine pill had changed and it used to be round but with the last rx refill it was oblong shaped. This is when the itching began. Per MD- Rx d/c, new rx for Tramadol written.  Oxycodone '5mg'$  IR refilled for pt. Pt advised he is out of town in [place] for an Eye appt and will pick up medications on Friday upon his return. No further concerns.

## 2016-02-05 ENCOUNTER — Telehealth: Payer: Self-pay

## 2016-02-05 NOTE — Telephone Encounter (Signed)
Patient calling for refills on oxycodone and tramadol.  Patient will be over to pick up prescriptions after 1:00 today.

## 2016-02-06 ENCOUNTER — Other Ambulatory Visit: Payer: Self-pay | Admitting: Medical Oncology

## 2016-02-06 DIAGNOSIS — C3490 Malignant neoplasm of unspecified part of unspecified bronchus or lung: Secondary | ICD-10-CM

## 2016-02-06 MED ORDER — TRAMADOL HCL 50 MG PO TABS: 50.0000 mg | ORAL_TABLET | Freq: Four times a day (QID) | ORAL | Status: DC | PRN

## 2016-02-06 MED ORDER — OXYCODONE HCL 5 MG PO TABS: 5.0000 mg | ORAL_TABLET | Freq: Four times a day (QID) | ORAL | Status: DC | PRN

## 2016-02-06 NOTE — Progress Notes (Signed)
rx locked up front

## 2016-02-26 ENCOUNTER — Other Ambulatory Visit: Payer: Self-pay | Admitting: *Deleted

## 2016-02-26 DIAGNOSIS — C3490 Malignant neoplasm of unspecified part of unspecified bronchus or lung: Secondary | ICD-10-CM

## 2016-02-26 MED ORDER — TRAMADOL HCL 50 MG PO TABS: 50.0000 mg | ORAL_TABLET | Freq: Four times a day (QID) | ORAL | Status: DC | PRN

## 2016-02-26 MED ORDER — OXYCODONE HCL 5 MG PO TABS: 5.0000 mg | ORAL_TABLET | Freq: Four times a day (QID) | ORAL | Status: DC | PRN

## 2016-02-26 NOTE — Telephone Encounter (Signed)
Reviewed with MD, notified pt for pick up

## 2016-02-26 NOTE — Addendum Note (Signed)
Addended by: Lucile Crater on: 02/26/2016 03:45 PM   Modules accepted: Orders

## 2016-02-26 NOTE — Telephone Encounter (Signed)
Pt called requesting refills of Oxycodone and Tramadol.  Stated Tramadol does not relieve pain much, pt has to double up on dosage - 100 mg every 6 hours along with Oxycodone to help with the pain.  Stated he has only 4 tabs of Tramadol left.   Please contact pt when scripts are ready for pick up. Pt's    Phone      [phone].

## 2016-02-28 ENCOUNTER — Telehealth: Payer: Self-pay | Admitting: Internal Medicine

## 2016-02-28 NOTE — Telephone Encounter (Addendum)
S/w pt, advised appt chg from 6/27 to 7/3 @ 9.30am due to md pal. Pt verbalized understanding. Advised pt, other appts for lab and ct remain unchanged.

## 2016-03-10 ENCOUNTER — Other Ambulatory Visit: Payer: Medicaid Other

## 2016-03-11 ENCOUNTER — Ambulatory Visit (HOSPITAL_COMMUNITY): Payer: Medicaid Other

## 2016-03-17 ENCOUNTER — Ambulatory Visit: Payer: Medicaid Other | Admitting: Internal Medicine

## 2016-03-18 ENCOUNTER — Other Ambulatory Visit (HOSPITAL_BASED_OUTPATIENT_CLINIC_OR_DEPARTMENT_OTHER): Payer: Medicaid Other

## 2016-03-18 ENCOUNTER — Ambulatory Visit (HOSPITAL_COMMUNITY)
Admission: RE | Admit: 2016-03-18 | Discharge: 2016-03-18 | Disposition: A | Discharge status: Home / Self Care | Payer: Medicaid Other | Source: Ambulatory Visit | Attending: Internal Medicine | Admitting: Internal Medicine

## 2016-03-18 DIAGNOSIS — N400 Enlarged prostate without lower urinary tract symptoms: Secondary | ICD-10-CM | POA: Insufficient documentation

## 2016-03-18 DIAGNOSIS — C7951 Secondary malignant neoplasm of bone: Secondary | ICD-10-CM | POA: Insufficient documentation

## 2016-03-18 DIAGNOSIS — I700 Atherosclerosis of aorta: Secondary | ICD-10-CM | POA: Insufficient documentation

## 2016-03-18 DIAGNOSIS — J439 Emphysema, unspecified: Secondary | ICD-10-CM | POA: Diagnosis not present

## 2016-03-18 DIAGNOSIS — C3411 Malignant neoplasm of upper lobe, right bronchus or lung: Secondary | ICD-10-CM

## 2016-03-18 DIAGNOSIS — N200 Calculus of kidney: Secondary | ICD-10-CM | POA: Insufficient documentation

## 2016-03-18 LAB — CBC WITH DIFFERENTIAL/PLATELET
BASO%: 0.6 % (ref 0.0–2.0)
Basophils Absolute: 0 10*3/uL (ref 0.0–0.1)
EOS%: 4.2 % (ref 0.0–7.0)
Eosinophils Absolute: 0.2 10*3/uL (ref 0.0–0.5)
HCT: 40.6 % (ref 38.4–49.9)
HGB: 13.2 g/dL (ref 13.0–17.1)
LYMPH%: 36.7 % (ref 14.0–49.0)
MCH: 28 pg (ref 27.2–33.4)
MCHC: 32.5 g/dL (ref 32.0–36.0)
MCV: 86.2 fL (ref 79.3–98.0)
MONO#: 0.5 10*3/uL (ref 0.1–0.9)
MONO%: 11.1 % (ref 0.0–14.0)
NEUT#: 2.3 10*3/uL (ref 1.5–6.5)
NEUT%: 47.4 % (ref 39.0–75.0)
Platelets: 199 10*3/uL (ref 140–400)
RBC: 4.71 10*6/uL (ref 4.20–5.82)
RDW: 15.6 % — ABNORMAL HIGH (ref 11.0–14.6)
WBC: 4.8 10*3/uL (ref 4.0–10.3)
lymph#: 1.8 10*3/uL (ref 0.9–3.3)

## 2016-03-18 LAB — COMPREHENSIVE METABOLIC PANEL
ALBUMIN: 3.8 g/dL (ref 3.5–5.0)
ALK PHOS: 73 U/L (ref 40–150)
ALT: 16 U/L (ref 0–55)
ANION GAP: 7 meq/L (ref 3–11)
AST: 25 U/L (ref 5–34)
BUN: 17 mg/dL (ref 7.0–26.0)
CALCIUM: 10.2 mg/dL (ref 8.4–10.4)
CO2: 29 mEq/L (ref 22–29)
Chloride: 103 mEq/L (ref 98–109)
Creatinine: 0.9 mg/dL (ref 0.7–1.3)
Glucose: 80 mg/dl (ref 70–140)
POTASSIUM: 4.5 meq/L (ref 3.5–5.1)
Sodium: 139 mEq/L (ref 136–145)
Total Bilirubin: 0.38 mg/dL (ref 0.20–1.20)
Total Protein: 7.6 g/dL (ref 6.4–8.3)

## 2016-03-18 MED ORDER — IOPAMIDOL (ISOVUE-300) INJECTION 61%
100.0000 mL | Freq: Once | INTRAVENOUS | Status: AC | PRN
  Administered 2016-03-18: 100 mL via INTRAVENOUS

## 2016-03-18 MED ORDER — DIATRIZOATE MEGLUMINE & SODIUM 66-10 % PO SOLN: 30.0000 mL | Freq: Once | ORAL | Status: DC

## 2016-03-23 ENCOUNTER — Telehealth: Payer: Self-pay | Admitting: Internal Medicine

## 2016-03-23 ENCOUNTER — Other Ambulatory Visit: Payer: Self-pay | Admitting: *Deleted

## 2016-03-23 ENCOUNTER — Ambulatory Visit (HOSPITAL_BASED_OUTPATIENT_CLINIC_OR_DEPARTMENT_OTHER): Payer: Medicaid Other | Admitting: Internal Medicine

## 2016-03-23 ENCOUNTER — Encounter: Payer: Self-pay | Admitting: Internal Medicine

## 2016-03-23 VITALS — BP 126/56 | HR 57 | Temp 97.9°F | Resp 18 | Ht >= 80 in | Wt 185.3 lb

## 2016-03-23 DIAGNOSIS — C7951 Secondary malignant neoplasm of bone: Secondary | ICD-10-CM

## 2016-03-23 DIAGNOSIS — G893 Neoplasm related pain (acute) (chronic): Secondary | ICD-10-CM | POA: Diagnosis not present

## 2016-03-23 DIAGNOSIS — C3490 Malignant neoplasm of unspecified part of unspecified bronchus or lung: Secondary | ICD-10-CM

## 2016-03-23 DIAGNOSIS — M5441 Lumbago with sciatica, right side: Secondary | ICD-10-CM

## 2016-03-23 DIAGNOSIS — C3411 Malignant neoplasm of upper lobe, right bronchus or lung: Secondary | ICD-10-CM | POA: Diagnosis not present

## 2016-03-23 MED ORDER — TRAMADOL HCL 50 MG PO TABS: 50.0000 mg | ORAL_TABLET | Freq: Four times a day (QID) | ORAL | Status: DC | PRN

## 2016-03-23 MED ORDER — OXYCODONE HCL 5 MG PO TABS: 5.0000 mg | ORAL_TABLET | Freq: Four times a day (QID) | ORAL | Status: DC | PRN

## 2016-03-23 NOTE — Progress Notes (Signed)
[facility] [phone]   Fax:([phone]  OFFICE PROGRESS NOTE  Herbert Mccreedy, MD [address] [place] [address]  DIAGNOSIS: Metastatic non-small cell lung cancer, squamous cell carcinoma initially diagnosed as a stage IIIB in March 2011 in [place], [place] THERAPY: 1) status post induction chemotherapy with carboplatin and paclitaxel followed by a course of concurrent chemoradiation with carboplatin and paclitaxel completed in July 2013 with stable disease in the chest. This was under the care of Dr. Bradly Chris in [place]. 2) status post decompressive T11-L1 laminectomies, right T11-12 nerve root division and T11-L1 posterior spinal fusion on 03/18/2014. 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 The tumor was negative for EGFR mutation and negative for ALK gene translocation. 3) status post [employer] radiosurgery to the vertebral body tumor in early August 2015.  CURRENT THERAPY: Observation.  INTERVAL HISTORY: Herbert Smith 60 y.o. male returns to the clinic today for follow-up visit. The patient is feeling fine today with no specific complaints except for the persistent low back pain. He is currently on tramadol and oxycodone 5 mg by mouth daily for our as needed for pain. He is also on Neurontin 600 mg by mouth 3 times a day in addition to when necessary Aleve. He is requesting refill of his medication. He doesn't think that his current pain medication is controlling his pain. He denied having any other significant complaints today. He denied having any significant chest pain, shortness of breath, cough or hemoptysis. He denied having any significant weight loss or night sweats. The patient has no nausea or vomiting, no fever or chills. His pain was getting worse and he had repeat CT scan of the chest, abdomen and pelvis performed recently and  he is here for evaluation and discussion of his scan results.  MEDICAL HISTORY: Past Medical History  Diagnosis Date  . Cancer ([facility])     lung ca dx'd 2011  . Primary cancer of right upper lobe of lung ([facility]) 09/12/2014    ALLERGIES:  is allergic to fentanyl.  MEDICATIONS:  Current Outpatient Prescriptions  Medication Sig Dispense Refill  . albuterol (PROVENTIL HFA;VENTOLIN HFA) 108 (90 BASE) MCG/ACT inhaler Inhale 2 puffs into the lungs every 6 (six) hours as needed for wheezing or shortness of breath.    . levofloxacin (LEVAQUIN) 500 MG tablet Take 1 tablet (500 mg total) by mouth daily. 7 tablet 0  . loratadine (CLARITIN) 10 MG tablet Take 1 tablet (10 mg total) by mouth daily. 30 tablet 0  . oxyCODONE (OXY IR/ROXICODONE) 5 MG immediate release tablet Take 1 tablet (5 mg total) by mouth every 6 (six) hours as needed for severe pain. 60 tablet 0  . oxymetazoline (AFRIN NASAL SPRAY) 0.05 % nasal spray Place 1 spray into both nostrils 2 (two) times daily. 30 mL 0  . tiotropium (SPIRIVA) 18 MCG inhalation capsule Place 18 mcg into inhaler and inhale 2 (two) times daily.    . traMADol (ULTRAM) 50 MG tablet Take 1 tablet (50 mg total) by mouth every 6 (six) hours as needed. 45 tablet 1   No current facility-administered medications for this visit.    SURGICAL HISTORY: No past surgical history on file.  REVIEW OF SYSTEMS:  A comprehensive review of systems was negative except for: Musculoskeletal: positive for back pain   PHYSICAL EXAMINATION: General appearance: alert, cooperative, fatigued and no distress Head: Normocephalic, without obvious abnormality, atraumatic  Neck: no adenopathy, no JVD, supple, symmetrical, trachea midline and thyroid not enlarged, symmetric, no tenderness/mass/nodules Lymph nodes: Cervical, supraclavicular, and axillary nodes normal. Resp: clear to auscultation bilaterally Back: symmetric, no curvature. ROM normal. No CVA tenderness. Cardio: regular rate and  rhythm, S1, S2 normal, no murmur, click, rub or gallop GI: soft, non-tender; bowel sounds normal; no masses,  no organomegaly Extremities: extremities normal, atraumatic, no cyanosis or edema Neurologic: Alert and oriented X 3, normal strength and tone. Normal symmetric reflexes. Normal coordination and gait  ECOG PERFORMANCE STATUS: 1 - Symptomatic but completely ambulatory  Blood pressure 126/56, pulse 57, temperature 97.9 F (36.6 C), temperature source Oral, resp. rate 18, height '6\' 8"'$  (2.032 m), weight 185 lb 4.8 oz (84.052 kg), SpO2 98 %.  LABORATORY DATA: Lab Results  Component Value Date   WBC 4.8 03/18/2016   HGB 13.2 03/18/2016   HCT 40.6 03/18/2016   MCV 86.2 03/18/2016   PLT 199 03/18/2016      Chemistry      Component Value Date/Time   NA 139 03/18/2016 1019   NA 133* 11/14/2015 2111   K 4.5 03/18/2016 1019   K 3.8 11/14/2015 2111   CL 97* 11/14/2015 2111   CO2 29 03/18/2016 1019   CO2 23 11/14/2015 2111   BUN 17.0 03/18/2016 1019   BUN 18 11/14/2015 2111   CREATININE 0.9 03/18/2016 1019   CREATININE 1.26* 11/14/2015 2111      Component Value Date/Time   CALCIUM 10.2 03/18/2016 1019   CALCIUM 9.7 11/14/2015 2111   ALKPHOS 73 03/18/2016 1019   ALKPHOS 64 11/13/2015 1205   AST 25 03/18/2016 1019   AST 21 11/13/2015 1205   ALT 16 03/18/2016 1019   ALT 19 11/13/2015 1205   BILITOT 0.38 03/18/2016 1019   BILITOT 0.6 11/13/2015 1205       RADIOGRAPHIC STUDIES: Ct Chest W Contrast  03/18/2016  CLINICAL DATA:  Primary right upper lobe lung carcinoma with metastatic disease. Restaging. EXAM: CT CHEST, ABDOMEN, AND PELVIS WITH CONTRAST TECHNIQUE: Multidetector CT imaging of the chest, abdomen and pelvis was performed following the standard protocol during bolus administration of intravenous contrast. CONTRAST:  120m ISOVUE-300 IOPAMIDOL (ISOVUE-300) INJECTION 61% COMPARISON:  09/13/2015 FINDINGS: CT CHEST FINDINGS Mediastinum/Lymph Nodes: No mediastinal, hilar,  or axillary lymphadenopathy identified. Lungs/Pleura: Severe bullous emphysema again demonstrated. Simple round fluid attenuation lesion in the posterior medial right upper lobe which abuts the pleural surface measures 5.9 cm and is unchanged. Right lower lobe pleural-parenchymal scarring appears stable. Tiny sub-cm pleural based pulmonary nodules in the left upper and lower lobes remain stable. Musculoskeletal: T12 vertebral body sclerotic bone metastases and posterior spinal fusion hardware remains stable. No new bone lesions identified. CT ABDOMEN PELVIS FINDINGS Hepatobiliary: No masses or other significant abnormality. Gallbladder is unremarkable. Pancreas: No mass, inflammatory changes, or other significant abnormality. Spleen: Within normal limits in size and appearance. Adrenals/Urinary Tract: Adrenal glands are normal in appearance. Right renal cyst and tiny nonobstructive bilateral renal calculi remain stable. No evidence of renal masses or hydronephrosis. Unopacified urinary bladder is unremarkable in appearance. Stomach/Bowel: No evidence of obstruction, inflammatory process, or abnormal fluid collections. Moderate to large amount of stool again noted. Vascular/Lymphatic: Ill-defined soft tissue density in the right retrocrural space measures 2.7 x 2.8 cm on image 66/series 2. This shows no significant change. No other sites of lymphadenopathy identified within the abdomen or pelvis. No evidence of abdominal aortic aneurysm. Aortic atherosclerosis noted. Reproductive: Mild to moderately enlarged prostate remains  stable. Other: None. Musculoskeletal:  No suspicious bone lesions identified. IMPRESSION: Stable exam. No new or progressive disease identified within the chest, abdomen, or pelvis. Stable ill-defined soft tissue density in right retrocrural space. Continued attention recommended on follow-up imaging. Stable sclerotic T12 vertebral body bone metastasis. Stable extensive bullous emphysema and  fluid density lesion/ bulla in medial right upper lobe. Incidental findings including aortic atherosclerosis, enlarged prostate, and bilateral nonobstructive renal calculi. Electronically Signed   By: Earle Gell M.D.   On: 03/18/2016 12:46   Ct Abdomen Pelvis W Contrast  03/18/2016  CLINICAL DATA:  Primary right upper lobe lung carcinoma with metastatic disease. Restaging. EXAM: CT CHEST, ABDOMEN, AND PELVIS WITH CONTRAST TECHNIQUE: Multidetector CT imaging of the chest, abdomen and pelvis was performed following the standard protocol during bolus administration of intravenous contrast. CONTRAST:  153m ISOVUE-300 IOPAMIDOL (ISOVUE-300) INJECTION 61% COMPARISON:  09/13/2015 FINDINGS: CT CHEST FINDINGS Mediastinum/Lymph Nodes: No mediastinal, hilar, or axillary lymphadenopathy identified. Lungs/Pleura: Severe bullous emphysema again demonstrated. Simple round fluid attenuation lesion in the posterior medial right upper lobe which abuts the pleural surface measures 5.9 cm and is unchanged. Right lower lobe pleural-parenchymal scarring appears stable. Tiny sub-cm pleural based pulmonary nodules in the left upper and lower lobes remain stable. Musculoskeletal: T12 vertebral body sclerotic bone metastases and posterior spinal fusion hardware remains stable. No new bone lesions identified. CT ABDOMEN PELVIS FINDINGS Hepatobiliary: No masses or other significant abnormality. Gallbladder is unremarkable. Pancreas: No mass, inflammatory changes, or other significant abnormality. Spleen: Within normal limits in size and appearance. Adrenals/Urinary Tract: Adrenal glands are normal in appearance. Right renal cyst and tiny nonobstructive bilateral renal calculi remain stable. No evidence of renal masses or hydronephrosis. Unopacified urinary bladder is unremarkable in appearance. Stomach/Bowel: No evidence of obstruction, inflammatory process, or abnormal fluid collections. Moderate to large amount of stool again noted.  Vascular/Lymphatic: Ill-defined soft tissue density in the right retrocrural space measures 2.7 x 2.8 cm on image 66/series 2. This shows no significant change. No other sites of lymphadenopathy identified within the abdomen or pelvis. No evidence of abdominal aortic aneurysm. Aortic atherosclerosis noted. Reproductive: Mild to moderately enlarged prostate remains stable. Other: None. Musculoskeletal:  No suspicious bone lesions identified. IMPRESSION: Stable exam. No new or progressive disease identified within the chest, abdomen, or pelvis. Stable ill-defined soft tissue density in right retrocrural space. Continued attention recommended on follow-up imaging. Stable sclerotic T12 vertebral body bone metastasis. Stable extensive bullous emphysema and fluid density lesion/ bulla in medial right upper lobe. Incidental findings including aortic atherosclerosis, enlarged prostate, and bilateral nonobstructive renal calculi. Electronically Signed   By: JEarle GellM.D.   On: 03/18/2016 12:46    ASSESSMENT AND PLAN: This is a very pleasant 60years old African-American male with metastatic non-small cell lung cancer initially diagnosed as a stage IIIB squamous cell carcinoma but the patient has metastatic disease at the T12 status post decompressive laminectomy and the final pathology was consistent with adenocarcinoma with negative EGFR mutation and negative ALK gene translocation. The recent CT scan of the chest, abdomen and pelvis showed stable disease. I discussed the scan results with the patient today. I recommended for him to continue on observation with repeat CT scan of the chest, abdomen and pelvis in 6 months. For pain management, he will continue with the current pain medication with tramadol and Percocet. I will also refer the patient to a pain clinic for reevaluation since his pain is not well controlled. He was advised to call  immediately if he has any concerning symptoms in the interval. The  patient voices understanding of current disease status and treatment options and is in agreement with the current care plan.  All questions were answered. The patient knows to call the clinic with any problems, questions or concerns. We can certainly see the patient much sooner if necessary.   Disclaimer: This note was dictated with voice recognition software. Similar sounding words can inadvertently be transcribed and may not be corrected upon review.

## 2016-03-23 NOTE — Telephone Encounter (Signed)
Pt refill on tramadol and Oxy are not due until 03/27/16. Rx printed for pt with note in system and on rx DO NOT REFILL UNTIL  March 27 2016. Spoke to pt in hallway after MD visit and discussed he may not refill Tramadol and Oxy until 03/27/16. Pt verbalized understanding.

## 2016-03-23 NOTE — Telephone Encounter (Signed)
left msg confirming 12/29 & 1/3 apt times

## 2016-04-28 ENCOUNTER — Telehealth: Payer: Self-pay | Admitting: Medical Oncology

## 2016-04-28 DIAGNOSIS — C3490 Malignant neoplasm of unspecified part of unspecified bronchus or lung: Secondary | ICD-10-CM

## 2016-04-28 MED ORDER — OXYCODONE HCL 5 MG PO TABS: 5.0000 mg | ORAL_TABLET | Freq: Four times a day (QID) | ORAL | 0 refills | Status: DC | PRN

## 2016-04-28 NOTE — Telephone Encounter (Signed)
Pt notified rx is ready.

## 2016-04-28 NOTE — Telephone Encounter (Signed)
Requests refill. I called pt mobile phone and told to pt call Herbert Smith. When he calls back he needs to know rx is ready.

## 2016-05-28 ENCOUNTER — Telehealth: Payer: Self-pay | Admitting: *Deleted

## 2016-05-28 ENCOUNTER — Other Ambulatory Visit: Payer: Self-pay | Admitting: Medical Oncology

## 2016-05-28 DIAGNOSIS — C3490 Malignant neoplasm of unspecified part of unspecified bronchus or lung: Secondary | ICD-10-CM

## 2016-05-28 MED ORDER — OXYCODONE HCL 5 MG PO TABS: 5.0000 mg | ORAL_TABLET | Freq: Four times a day (QID) | ORAL | 0 refills | Status: DC | PRN

## 2016-05-28 MED ORDER — TRAMADOL HCL 50 MG PO TABS: 50.0000 mg | ORAL_TABLET | Freq: Four times a day (QID) | ORAL | 1 refills | Status: DC | PRN

## 2016-05-28 NOTE — Telephone Encounter (Signed)
Received call from pt requesting refill on his pain med.  He asked for tramadol & oxycodone.  He is in [place] now at a friends home & doesn't have his cell & request call back at friends home # [phone] or mobile [phone].  Message to Dr Julien Nordmann & Pod RN

## 2016-05-28 NOTE — Telephone Encounter (Signed)
Left message to pick up rx

## 2016-05-29 ENCOUNTER — Telehealth: Payer: Self-pay | Admitting: *Deleted

## 2016-05-29 ENCOUNTER — Telehealth: Payer: Self-pay | Admitting: Medical Oncology

## 2016-05-29 NOTE — Telephone Encounter (Signed)
Pt called regarding pain scripts.  Informed that they are ready for pick up.

## 2016-05-29 NOTE — Telephone Encounter (Signed)
Asks for appt - He stated another doctor told him he has a "tumor growing on his spine" and needs to see [facility].Marland Kitchen Next appt jan . Note to [place].

## 2016-06-02 ENCOUNTER — Telehealth: Payer: Self-pay | Admitting: *Deleted

## 2016-06-02 ENCOUNTER — Encounter (HOSPITAL_COMMUNITY): Payer: Self-pay

## 2016-06-02 ENCOUNTER — Inpatient Hospital Stay (HOSPITAL_COMMUNITY)
Admission: EM | Admit: 2016-06-02 | Discharge: 2016-06-05 | DRG: 544 | Disposition: A | Discharge status: Home / Self Care | Payer: Medicaid Other | Attending: Internal Medicine | Admitting: Internal Medicine

## 2016-06-02 DIAGNOSIS — C7951 Secondary malignant neoplasm of bone: Secondary | ICD-10-CM | POA: Diagnosis not present

## 2016-06-02 DIAGNOSIS — M545 Low back pain, unspecified: Secondary | ICD-10-CM | POA: Diagnosis present

## 2016-06-02 DIAGNOSIS — Z79899 Other long term (current) drug therapy: Secondary | ICD-10-CM

## 2016-06-02 DIAGNOSIS — Z85118 Personal history of other malignant neoplasm of bronchus and lung: Secondary | ICD-10-CM

## 2016-06-02 DIAGNOSIS — K5903 Drug induced constipation: Secondary | ICD-10-CM

## 2016-06-02 DIAGNOSIS — G893 Neoplasm related pain (acute) (chronic): Secondary | ICD-10-CM

## 2016-06-02 DIAGNOSIS — M5432 Sciatica, left side: Secondary | ICD-10-CM

## 2016-06-02 DIAGNOSIS — F17210 Nicotine dependence, cigarettes, uncomplicated: Secondary | ICD-10-CM | POA: Diagnosis present

## 2016-06-02 DIAGNOSIS — Z515 Encounter for palliative care: Secondary | ICD-10-CM

## 2016-06-02 DIAGNOSIS — T402X5A Adverse effect of other opioids, initial encounter: Secondary | ICD-10-CM

## 2016-06-02 DIAGNOSIS — Z720 Tobacco use: Secondary | ICD-10-CM

## 2016-06-02 DIAGNOSIS — Z888 Allergy status to other drugs, medicaments and biological substances status: Secondary | ICD-10-CM

## 2016-06-02 LAB — CBC WITH DIFFERENTIAL/PLATELET
BASOS ABS: 0 10*3/uL (ref 0.0–0.1)
BASOS PCT: 1 %
EOS PCT: 4 %
Eosinophils Absolute: 0.3 10*3/uL (ref 0.0–0.7)
HCT: 43.1 % (ref 39.0–52.0)
Hemoglobin: 13.9 g/dL (ref 13.0–17.0)
Lymphocytes Relative: 40 %
Lymphs Abs: 2.5 10*3/uL (ref 0.7–4.0)
MCH: 28.3 pg (ref 26.0–34.0)
MCHC: 32.3 g/dL (ref 30.0–36.0)
MCV: 87.6 fL (ref 78.0–100.0)
MONO ABS: 0.5 10*3/uL (ref 0.1–1.0)
Monocytes Relative: 8 %
Neutro Abs: 3 10*3/uL (ref 1.7–7.7)
Neutrophils Relative %: 47 %
PLATELETS: 258 10*3/uL (ref 150–400)
RBC: 4.92 MIL/uL (ref 4.22–5.81)
RDW: 15.9 % — AB (ref 11.5–15.5)
WBC: 6.4 10*3/uL (ref 4.0–10.5)

## 2016-06-02 LAB — COMPREHENSIVE METABOLIC PANEL
ALBUMIN: 4.3 g/dL (ref 3.5–5.0)
ALT: 28 U/L (ref 17–63)
ANION GAP: 7 (ref 5–15)
AST: 24 U/L (ref 15–41)
Alkaline Phosphatase: 61 U/L (ref 38–126)
BUN: 22 mg/dL — AB (ref 6–20)
CHLORIDE: 100 mmol/L — AB (ref 101–111)
CO2: 31 mmol/L (ref 22–32)
Calcium: 10.2 mg/dL (ref 8.9–10.3)
Creatinine, Ser: 0.88 mg/dL (ref 0.61–1.24)
GFR calc Af Amer: >60 mL/min (ref >60)
Glucose, Bld: 82 mg/dL (ref 65–99)
POTASSIUM: 4.3 mmol/L (ref 3.5–5.1)
Sodium: 138 mmol/L (ref 135–145)
Total Bilirubin: 0.5 mg/dL (ref 0.3–1.2)
Total Protein: 8 g/dL (ref 6.5–8.1)

## 2016-06-02 LAB — I-STAT CHEM 8, ED
BUN: 26 mg/dL — AB (ref 6–20)
CALCIUM ION: 1.27 mmol/L (ref 1.15–1.40)
CHLORIDE: 97 mmol/L — AB (ref 101–111)
Creatinine, Ser: 0.8 mg/dL (ref 0.61–1.24)
Glucose, Bld: 80 mg/dL (ref 65–99)
HEMATOCRIT: 46 % (ref 39.0–52.0)
Hemoglobin: 15.6 g/dL (ref 13.0–17.0)
POTASSIUM: 4.2 mmol/L (ref 3.5–5.1)
SODIUM: 140 mmol/L (ref 135–145)
TCO2: 34 mmol/L (ref 0–100)

## 2016-06-02 LAB — GLUCOSE, CAPILLARY
Glucose-Capillary: 73 mg/dL (ref 65–99)
Glucose-Capillary: 108 mg/dL — ABNORMAL HIGH (ref 65–99)

## 2016-06-02 MED ORDER — ALBUTEROL SULFATE (2.5 MG/3ML) 0.083% IN NEBU: 2.5000 mg | INHALATION_SOLUTION | Freq: Four times a day (QID) | RESPIRATORY_TRACT | Status: DC | PRN

## 2016-06-02 MED ORDER — DIPHENHYDRAMINE HCL 25 MG PO CAPS: 50.0000 mg | ORAL_CAPSULE | Freq: Every evening | ORAL | Status: DC | PRN

## 2016-06-02 MED ORDER — KETOROLAC TROMETHAMINE 30 MG/ML IJ SOLN: 30.0000 mg | Freq: Four times a day (QID) | INTRAMUSCULAR | Status: DC | PRN

## 2016-06-02 MED ORDER — DEXAMETHASONE 4 MG PO TABS
4.0000 mg | ORAL_TABLET | Freq: Four times a day (QID) | ORAL | Status: DC
  Administered 2016-06-02 – 2016-06-05 (×11): 4 mg via ORAL
  Filled 2016-06-02 (×11): qty 1

## 2016-06-02 MED ORDER — TIOTROPIUM BROMIDE MONOHYDRATE 18 MCG IN CAPS
18.0000 ug | ORAL_CAPSULE | Freq: Two times a day (BID) | RESPIRATORY_TRACT | Status: DC
Start: 2016-06-02 — End: 2016-06-05
  Administered 2016-06-02 – 2016-06-04 (×4): 18 ug via RESPIRATORY_TRACT
  Filled 2016-06-02: qty 5

## 2016-06-02 MED ORDER — ONDANSETRON HCL 4 MG/2ML IJ SOLN: 4.0000 mg | Freq: Four times a day (QID) | INTRAMUSCULAR | Status: DC | PRN

## 2016-06-02 MED ORDER — ACETAMINOPHEN 650 MG RE SUPP: 650.0000 mg | Freq: Four times a day (QID) | RECTAL | Status: DC | PRN

## 2016-06-02 MED ORDER — HYDROMORPHONE HCL 1 MG/ML IJ SOLN
1.0000 mg | Freq: Once | INTRAMUSCULAR | Status: AC
  Administered 2016-06-02: 1 mg via INTRAVENOUS
  Filled 2016-06-02: qty 1

## 2016-06-02 MED ORDER — HYDROMORPHONE HCL 1 MG/ML IJ SOLN
1.0000 mg | INTRAMUSCULAR | Status: DC | PRN
  Administered 2016-06-02 – 2016-06-05 (×12): 1 mg via INTRAVENOUS
  Filled 2016-06-02 (×12): qty 1

## 2016-06-02 MED ORDER — ENOXAPARIN SODIUM 40 MG/0.4ML ~~LOC~~ SOLN
40.0000 mg | SUBCUTANEOUS | Status: DC
  Administered 2016-06-02 – 2016-06-04 (×3): 40 mg via SUBCUTANEOUS
  Filled 2016-06-02 (×3): qty 0.4

## 2016-06-02 MED ORDER — SENNOSIDES-DOCUSATE SODIUM 8.6-50 MG PO TABS
2.0000 | ORAL_TABLET | Freq: Two times a day (BID) | ORAL | Status: DC
  Administered 2016-06-02 – 2016-06-05 (×6): 2 via ORAL
  Filled 2016-06-02 (×6): qty 2

## 2016-06-02 MED ORDER — BISACODYL 5 MG PO TBEC: 5.0000 mg | DELAYED_RELEASE_TABLET | Freq: Every day | ORAL | Status: DC | PRN

## 2016-06-02 MED ORDER — ALBUTEROL SULFATE (2.5 MG/3ML) 0.083% IN NEBU: 3.0000 mL | INHALATION_SOLUTION | Freq: Four times a day (QID) | RESPIRATORY_TRACT | Status: DC | PRN

## 2016-06-02 MED ORDER — ONDANSETRON HCL 4 MG PO TABS: 4.0000 mg | ORAL_TABLET | Freq: Four times a day (QID) | ORAL | Status: DC | PRN

## 2016-06-02 MED ORDER — ACETAMINOPHEN 325 MG PO TABS: 650.0000 mg | ORAL_TABLET | Freq: Four times a day (QID) | ORAL | Status: DC | PRN

## 2016-06-02 MED ORDER — GABAPENTIN 300 MG PO CAPS
300.0000 mg | ORAL_CAPSULE | Freq: Three times a day (TID) | ORAL | Status: DC
  Administered 2016-06-02 – 2016-06-05 (×8): 300 mg via ORAL
  Filled 2016-06-02 (×8): qty 1

## 2016-06-02 MED ORDER — OXYCODONE HCL 5 MG PO TABS
10.0000 mg | ORAL_TABLET | ORAL | Status: DC | PRN
  Administered 2016-06-02 – 2016-06-04 (×5): 10 mg via ORAL
  Filled 2016-06-02 (×5): qty 2

## 2016-06-02 MED ORDER — PANTOPRAZOLE SODIUM 40 MG PO TBEC
40.0000 mg | DELAYED_RELEASE_TABLET | Freq: Every day | ORAL | Status: DC
  Administered 2016-06-02 – 2016-06-05 (×4): 40 mg via ORAL
  Filled 2016-06-02 (×5): qty 1

## 2016-06-02 MED ORDER — INSULIN ASPART 100 UNIT/ML ~~LOC~~ SOLN
0.0000 [IU] | Freq: Three times a day (TID) | SUBCUTANEOUS | Status: DC
  Administered 2016-06-03: 2 [IU] via SUBCUTANEOUS
  Administered 2016-06-03 (×2): 1 [IU] via SUBCUTANEOUS

## 2016-06-02 MED ORDER — ONDANSETRON HCL 4 MG/2ML IJ SOLN
4.0000 mg | Freq: Once | INTRAMUSCULAR | Status: AC
  Administered 2016-06-02: 4 mg via INTRAVENOUS
  Filled 2016-06-02: qty 2

## 2016-06-02 NOTE — Telephone Encounter (Signed)
Pt called states " I need an appt with Dr. 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 That's what the MRI showed."  Pt advised he is unable to walk, "except a little bit", pain, constipation and staying with a friend Lelon Frohlich who is helping him right now. Reviewed with MD, returned call to pt on Phone # given [phone] Lelon Frohlich ) unable to reach pt, lmovm with instructions per MD to go to ED as this could be possible cord compression.  Multiple attempts made to reach pt, message left with instruction to go to ED, to call office with confirmation message has been received.

## 2016-06-02 NOTE — ED Triage Notes (Addendum)
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  Pt told by MD to come d/t numbness in back.  Pt is ambulatory.  Painful.  Told to get MRI.  No incontinence.  Numbness and pain down bilateral leg x a while.  Pt had bone removed in 2014. Told he would have pain.  Pain has worsened.

## 2016-06-02 NOTE — ED Notes (Signed)
Attempted to call report.  Receiving RN unavailable and will call back.

## 2016-06-02 NOTE — ED Provider Notes (Signed)
[facility] DEPT Provider Note   CSN: [id number] Arrival date & time: 06/02/16  1153     History   Chief Complaint Chief Complaint  Patient presents with  . Back Pain    HPI Herbert Smith is a 60 y.o. male.  Patient complains of lower back pain. 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 Patient has had recent increased pain and weakness in his left leg he had an MRI that shows tumor around T11-12 is unknown if this is worse than it was before   The history is provided by the patient. No language interpreter was used.  Back Pain   This is a recurrent problem. The current episode started more than 2 days ago. The problem occurs constantly. The problem has not changed since onset.The pain is associated with no known injury. The pain is present in the thoracic spine. The quality of the pain is described as aching. The pain radiates to the left thigh. The pain is at a severity of 6/10. The pain is moderate. The symptoms are aggravated by twisting. The pain is the same all the time. Pertinent negatives include no chest pain, no headaches and no abdominal pain. He has tried nothing for the symptoms. The treatment provided no relief. Risk factors include a history of steroid use.    Past Medical History:  Diagnosis Date  . Cancer ([facility])    lung ca dx'd 2011  . Primary cancer of right upper lobe of lung ([facility]) 09/12/2014    Patient Active Problem List   Diagnosis Date Noted  . Back pain 09/26/2014  . Primary cancer of right upper lobe of lung ([facility]) 09/12/2014    History reviewed. No pertinent surgical history.     Home Medications    Prior to Admission medications   Medication Sig Start Date End Date Taking? Authorizing Provider  albuterol (PROVENTIL HFA;VENTOLIN HFA) 108 (90 BASE) MCG/ACT inhaler Inhale 2 puffs into the lungs every 6 (six) hours as needed for wheezing or shortness of breath.   Yes Historical Provider, MD  albuterol (PROVENTIL)  (2.5 MG/3ML) 0.083% nebulizer solution Take 2.5 mg by nebulization every 6 (six) hours as needed for wheezing or shortness of breath.   Yes Historical Provider, MD  bisacodyl (DULCOLAX) 5 MG EC tablet Take 5 mg by mouth daily as needed for moderate constipation.   Yes Historical Provider, MD  diphenhydrAMINE (BENADRYL) 25 MG tablet Take 50 mg by mouth at bedtime as needed for sleep.   Yes Historical Provider, MD  gabapentin (NEURONTIN) 300 MG capsule Take 300 mg by mouth 3 (three) times daily.   Yes Historical Provider, MD  oxyCODONE (OXY IR/ROXICODONE) 5 MG immediate release tablet Take 1 tablet (5 mg total) by mouth every 6 (six) hours as needed for severe pain. 05/28/16  Yes Curt Bears, MD  tiotropium (SPIRIVA) 18 MCG inhalation capsule Place 18 mcg into inhaler and inhale 2 (two) times daily.   Yes Historical Provider, MD  traMADol (ULTRAM) 50 MG tablet Take 50 mg by mouth every 6 (six) hours as needed for moderate pain.   Yes Historical Provider, MD    Family History History reviewed. No pertinent family history.  Social History Social History  Substance Use Topics  . Smoking status: Current Every Day Smoker    Packs/day: 1.50    Years: 15.00    Types: Cigarettes  . Smokeless tobacco: Never Used  . Alcohol use No     Allergies   Fentanyl  Review of Systems Review of Systems  Constitutional: Negative for appetite change and fatigue.  HENT: Negative for congestion, ear discharge and sinus pressure.   Eyes: Negative for discharge.  Respiratory: Negative for cough.   Cardiovascular: Negative for chest pain.  Gastrointestinal: Negative for abdominal pain and diarrhea.  Genitourinary: Negative for frequency and hematuria.  Musculoskeletal: Positive for back pain.  Skin: Negative for rash.  Neurological: Negative for seizures and headaches.  Psychiatric/Behavioral: Negative for hallucinations.     Physical Exam Updated Vital Signs There were no vitals taken for this  visit.  Physical Exam  Constitutional: He is oriented to person, place, and time. He appears well-developed.  HENT:  Head: Normocephalic.  Eyes: Conjunctivae are normal.  Neck: No tracheal deviation present.  Cardiovascular:  No murmur heard. Musculoskeletal: Normal range of motion.  Tender lumbar spine.  Neurological: He is oriented to person, place, and time.  Patient with weakness and minimal left leg.  Skin: Skin is warm.  Psychiatric: He has a normal mood and affect.     ED Treatments / Results  Labs (all labs ordered are listed, but only abnormal results are displayed) Labs Reviewed  CBC WITH DIFFERENTIAL/PLATELET - Abnormal; Notable for the following:       Result Value   RDW 15.9 (*)    All other components within normal limits  COMPREHENSIVE METABOLIC PANEL - Abnormal; Notable for the following:    Chloride 100 (*)    BUN 22 (*)    All other components within normal limits  I-STAT CHEM 8, ED - Abnormal; Notable for the following:    Chloride 97 (*)    BUN 26 (*)    All other components within normal limits    EKG  EKG Interpretation None       Radiology No results found.  Procedures Procedures (including critical care time)  Medications Ordered in ED Medications  HYDROmorphone (DILAUDID) injection 1 mg (1 mg Intravenous Given 06/02/16 1505)  ondansetron (ZOFRAN) injection 4 mg (4 mg Intravenous Given 06/02/16 1505)     Initial Impression / Assessment and Plan / ED Course  I have reviewed the triage vital signs and the nursing notes.  Pertinent labs & imaging results that were available during my care of the patient were reviewed by me and considered in my medical decision making (see chart for details).  Clinical Course    Patient with back pain and tumor and thoracic spine. Metastasis from lung cancer. I spoke with Dr. Earlie Server who is his oncologist.   It was decided to have the patient admitted to medicine and it will be decided whether the  patient needs aggressive treatment for this tumor  Final Clinical Impressions(s) / ED Diagnoses   Final diagnoses:  None    New Prescriptions New Prescriptions   No medications on file     Milton Ferguson, MD 06/02/16 [phone]

## 2016-06-02 NOTE — Progress Notes (Signed)
Patient arrived to floor at around 1810, from ED. Patient is alert and oriented  X3. Vital signs obtained and recorded.  Oriented to room and environment.Placed comfortably in bed. Awaiting for pharmacy to verify orders. Will continue to monitor.

## 2016-06-02 NOTE — H&P (Signed)
TRH H&P   Patient Demographics:    Herbert Smith, is a 60 y.o. male  MRN: [id number]   DOB - 11-May-1956  Admit Date - 06/02/2016  Outpatient Primary MD for the patient is Benito Mccreedy, MD  Referring MD: Dr. Roderic Palau  Outpatient Specialists:  Oncology (Dr. Julien Nordmann)    Patient coming from: Home  Chief Complaint  Patient presents with  . Back Pain      HPI:    Herbert Smith  is a 60 y.o. male, With history of metastatic non-small cell lung cancer, squamosal carcinoma diagnosed as stage IIIB in March 2011 in [place] and underwent induction chemotherapy with carboplatin and paclitaxel followed by chemoradiation in July 2013 with stable disease. He also underwent decompressive T11-L1 laminectomies, right T11-12 never division and T11-L1 posterior spinal fusion in June 2015. The pathology showed poorly differentiated adenocarcinoma with focal squamous features. He also underwent CyberKnife radiosurgery to the vertebral body tumor in August 2015. He is following up with Dr. Julien Nordmann and being observed for a stable disease. Over the past few weeks patient was having increased low back pain. Patient reports having CT of his abdomen and pelvis 2 months back which was concerning for a tumor in the spine. He was referred to a spinal surgeon in [facility] who ordered an MRI of his thoracic and lumbar spine which showed metastatic disease of L1. No cord compression was noted. Over the past 2 weeks his low back pain has been worsened radiating to right back and groin. Denies radiation of pain to his thighs or legs. His home medications are not effectively controlling his pain. Denies any urinary retention or bowel incontinence (patient does inform that he has some urinary hesitancy for the past few weeks).   Patient went to see his spinal surgeon for his MRI result last week and was  started on 5-7 days of prednisone taper which she completed 2 days back. He was then referred to Dr. Julien Nordmann . Patient called Dr. Earlie Server and was asked to come to ED for observation.  Vitals in the ED including labs were stable.   Review of systems:    In addition to the HPI above,   Low back pain No Fever-chills, No Headache, No changes with Vision or hearing, No problems swallowing food or Liquids, No Chest pain, Cough or Shortness of Breath, No Abdominal pain, No Nausea or Vommitting, Bowel movements are regular, No Blood in stool or Urine, No dysuria, No new skin rashes or bruises, No new joints pains-aches,  No new weakness, tingling, numbness in any extremity, No recent weight gain or loss, No polyuria, polydypsia or polyphagia, No significant Mental Stressors.  A full 10 point Review of Systems was done, except as stated above, all other Review of Systems were negative.   With Past History of the following :    Past Medical History:  Diagnosis Date  . Cancer ([place])    lung ca dx'd 2011  . Primary cancer of right upper lobe of lung ([place]) 09/12/2014      Past surgical history Thoracic laminectomies   Social History:     Social History  Substance Use Topics  . Smoking status: Current Every Day Smoker    Packs/day: 1.50    Years: 15.00    Types: Cigarettes  . Smokeless tobacco: Never Used  . Alcohol use No     Lives - At home with wife  Mobility - ambulatory    Family History :   No family history of heart disease, diabetes or cancer   Home Medications:   Prior to Admission medications   Medication Sig Start Date End Date Taking? Authorizing Provider  albuterol (PROVENTIL HFA;VENTOLIN HFA) 108 (90 BASE) MCG/ACT inhaler Inhale 2 puffs into the lungs every 6 (six) hours as needed for wheezing or shortness of breath.   Yes Historical Provider, MD  albuterol (PROVENTIL) (2.5 MG/3ML) 0.083% nebulizer solution Take 2.5 mg by nebulization every 6 (six)  hours as needed for wheezing or shortness of breath.   Yes Historical Provider, MD  bisacodyl (DULCOLAX) 5 MG EC tablet Take 5 mg by mouth daily as needed for moderate constipation.   Yes Historical Provider, MD  diphenhydrAMINE (BENADRYL) 25 MG tablet Take 50 mg by mouth at bedtime as needed for sleep.   Yes Historical Provider, MD  gabapentin (NEURONTIN) 300 MG capsule Take 300 mg by mouth 3 (three) times daily.   Yes Historical Provider, MD  oxyCODONE (OXY IR/ROXICODONE) 5 MG immediate release tablet Take 1 tablet (5 mg total) by mouth every 6 (six) hours as needed for severe pain. 05/28/16  Yes Curt Bears, MD  tiotropium (SPIRIVA) 18 MCG inhalation capsule Place 18 mcg into inhaler and inhale 2 (two) times daily.   Yes Historical Provider, MD  traMADol (ULTRAM) 50 MG tablet Take 50 mg by mouth every 6 (six) hours as needed for moderate pain.   Yes Historical Provider, MD     Allergies:     Allergies  Allergen Reactions  . Fentanyl Itching     Physical Exam:   Vitals  Blood pressure 118/66, pulse (!) 51, temperature 97.5 F (36.4 C), temperature source Oral, resp. rate 16, height '6\' 8"'$  (2.032 m), weight 83 kg (183 lb), SpO2 94 %.  Gen.: Middle aged male not in distress HEENT: No pallor, moist mucosa, supple neck, no cervical lymphadenopathy   chest: Clear bilaterally CVS: Normal S1 and S2, no murmurs or gallop GI: Soft, nondistended, nontender, bowel sounds present Musculoskeletal: Warm, no edema, bulging of the lumbar spine with tenderness to pressure. CNS: Alert and oriented, normal power and strength in all extremities, no sensory deficit    Data Review:    CBC  Recent Labs Lab 06/02/16 1502 06/02/16 1512  WBC 6.4  --   HGB 13.9 15.6  HCT 43.1 46.0  PLT 258  --   MCV 87.6  --   MCH 28.3  --   MCHC 32.3  --   RDW 15.9*  --   LYMPHSABS 2.5  --   MONOABS 0.5  --   EOSABS 0.3  --   BASOSABS 0.0  --     ------------------------------------------------------------------------------------------------------------------  Chemistries   Recent Labs Lab 06/02/16 1502 06/02/16 1512  NA 138 140  K 4.3 4.2  CL 100* 97*  CO2 31  --   GLUCOSE 82 80  BUN 22* 26*  CREATININE 0.88 0.80  CALCIUM 10.2  --   AST 24  --   ALT 28  --   ALKPHOS 61  --   BILITOT 0.5  --    ------------------------------------------------------------------------------------------------------------------ estimated creatinine clearance is 113.8 mL/min (by C-G formula based on SCr of 0.8 mg/dL). ------------------------------------------------------------------------------------------------------------------ No results for input(s): TSH, T4TOTAL, T3FREE, THYROIDAB in the last 72 hours.  Invalid input(s): FREET3  Coagulation profile No results for input(s): INR, PROTIME in the last 168 hours. ------------------------------------------------------------------------------------------------------------------- No results for input(s): DDIMER in the last 72 hours. -------------------------------------------------------------------------------------------------------------------  Cardiac Enzymes No results for input(s): CKMB, TROPONINI, MYOGLOBIN in the last 168 hours.  Invalid input(s): CK ------------------------------------------------------------------------------------------------------------------ No results found for: BNP   ---------------------------------------------------------------------------------------------------------------  Urinalysis    Component Value Date/Time   COLORURINE AMBER (A) 11/14/2015 2111   APPEARANCEUR CLEAR 11/14/2015 2111   LABSPEC 1.023 11/14/2015 2111   PHURINE 5.5 11/14/2015 2111   GLUCOSEU NEGATIVE 11/14/2015 2111   HGBUR SMALL (A) 11/14/2015 2111   BILIRUBINUR NEGATIVE 11/14/2015 2111   [place] NEGATIVE 11/14/2015 2111   PROTEINUR NEGATIVE 11/14/2015 2111   NITRITE  NEGATIVE 11/14/2015 2111   LEUKOCYTESUR NEGATIVE 11/14/2015 2111    ----------------------------------------------------------------------------------------------------------------   Imaging Results:    No results found.     Assessment & Plan:    Active Problems:   Metastatic cancer to spine [facility]) New L1 metastatic disease Place in observation No definite neurological deficit. Decadron 4 mg every 6 hours. Add PPI and CBG monitoring. Increase home dose of oxycodone to 10 mg every 6 hours as needed for pain. Add when necessary Toradol (30 mg every 6 hours as needed) and IV Dilaudid 1 mg every 3 hours as needed for better pain control. Continue gabapentin. Dr. Julien Nordmann plans to involve radiation oncology.  MRI findings discussed with neurosurgery Dr. Kathyrn Sheriff, recommended dose no concerning cord involvement and did not require neurosurgical intervention.  Tobacco use Counseled on cessation. Continue home inhaler    Diet: Regular   DVT Prophylaxis  Lovenox -  AM Labs Ordered, also please review Full Orders  Family Communication: wife at bedside  Code Status full code  Likely DC to   home  Condition fair  Consults called:  Dr Julien Nordmann   Admission status: observation  Time spent in minutes : 50   Louellen Molder M.D on 06/02/2016 at 5:18 PM  Between 7am to 7pm - Pager - [phone]. After 7pm go to [email or web address] - password [facility]  [facility] - Office  [phone]

## 2016-06-03 ENCOUNTER — Other Ambulatory Visit: Payer: Self-pay | Admitting: Medical Oncology

## 2016-06-03 ENCOUNTER — Ambulatory Visit
Admit: 2016-06-03 | Discharge: 2016-06-03 | Disposition: A | Discharge status: Home / Self Care | Payer: Medicaid Other | Attending: Radiation Oncology | Admitting: Radiation Oncology

## 2016-06-03 DIAGNOSIS — Z515 Encounter for palliative care: Secondary | ICD-10-CM | POA: Diagnosis not present

## 2016-06-03 DIAGNOSIS — C3490 Malignant neoplasm of unspecified part of unspecified bronchus or lung: Secondary | ICD-10-CM

## 2016-06-03 DIAGNOSIS — C7951 Secondary malignant neoplasm of bone: Secondary | ICD-10-CM | POA: Diagnosis not present

## 2016-06-03 DIAGNOSIS — Z888 Allergy status to other drugs, medicaments and biological substances status: Secondary | ICD-10-CM | POA: Diagnosis not present

## 2016-06-03 DIAGNOSIS — F17210 Nicotine dependence, cigarettes, uncomplicated: Secondary | ICD-10-CM | POA: Diagnosis present

## 2016-06-03 DIAGNOSIS — Z79899 Other long term (current) drug therapy: Secondary | ICD-10-CM | POA: Diagnosis not present

## 2016-06-03 DIAGNOSIS — M545 Low back pain, unspecified: Secondary | ICD-10-CM

## 2016-06-03 DIAGNOSIS — Z7951 Long term (current) use of inhaled steroids: Secondary | ICD-10-CM | POA: Diagnosis not present

## 2016-06-03 DIAGNOSIS — Z720 Tobacco use: Secondary | ICD-10-CM

## 2016-06-03 DIAGNOSIS — K5903 Drug induced constipation: Secondary | ICD-10-CM | POA: Diagnosis not present

## 2016-06-03 DIAGNOSIS — G893 Neoplasm related pain (acute) (chronic): Secondary | ICD-10-CM | POA: Diagnosis not present

## 2016-06-03 DIAGNOSIS — Z85118 Personal history of other malignant neoplasm of bronchus and lung: Secondary | ICD-10-CM | POA: Diagnosis not present

## 2016-06-03 DIAGNOSIS — C3411 Malignant neoplasm of upper lobe, right bronchus or lung: Secondary | ICD-10-CM

## 2016-06-03 LAB — GLUCOSE, CAPILLARY
GLUCOSE-CAPILLARY: 142 mg/dL — AB (ref 65–99)
GLUCOSE-CAPILLARY: 127 mg/dL — AB (ref 65–99)
GLUCOSE-CAPILLARY: 156 mg/dL — AB (ref 65–99)
GLUCOSE-CAPILLARY: 96 mg/dL (ref 65–99)

## 2016-06-03 MED ORDER — ENSURE ENLIVE PO LIQD
237.0000 mL | Freq: Three times a day (TID) | ORAL | Status: DC
  Administered 2016-06-03 – 2016-06-05 (×7): 237 mL via ORAL

## 2016-06-03 NOTE — Progress Notes (Signed)
Initial Nutrition Assessment  DOCUMENTATION CODES:   Not applicable  INTERVENTION:  -Ensure Enlive po TID, each supplement provides 350 kcal and 20 grams of protein -RD to continue to monitor  NUTRITION DIAGNOSIS:   Inadequate oral intake related to poor appetite as evidenced by per patient/family report.  GOAL:   Patient will meet greater than or equal to 90% of their needs  MONITOR:   PO intake, Labs, I & O's, Skin, Weight trends, Supplement acceptance  REASON FOR ASSESSMENT:   Malnutrition Screening Tool    ASSESSMENT:   Herbert Smith  is a 60 y.o. male, With history of metastatic non-small cell lung cancer, squamosal carcinoma diagnosed as stage IIIB in March 2011 in [place] and underwent induction chemotherapy with carboplatin and paclitaxel followed by chemoradiation in July 2013 with stable disease  Spoke with Herbert Smith at bedside. He endorses good appetite at home, but that "food makes him hurt now." He also endorses 50-60# wt loss over 4-5 months. Per chart review he exhibits a 26#/13% insignificant wt loss over a year and 1 month. For breakfast this morning he had scrambled eggs, bacon, cereal, and toast - which he stated "wasn't enough food." Encouraged him to ask for snacks, he said he received graham crackers from his RN earlier but patient also was interested in strawberry ensure. Pt declined specific snacks he would like. Pt is on observation at this time. Labs and medications reviewed: Decadron, Senokot-S  Diet Order:  Diet regular Room service appropriate? Yes; Fluid consistency: Thin  Skin:  Reviewed, no issues  Last BM:  PTA  Height:   Ht Readings from Last 1 Encounters:  06/02/16 '6\' 8"'$  (2.032 m)    Weight:   Wt Readings from Last 1 Encounters:  06/02/16 182 lb 15.7 oz (83 kg)    Ideal Body Weight:  102.72 kg  BMI:  Body mass index is 20.1 kg/m.  Estimated Nutritional Needs:   Kcal:  4383-7793 calories  Protein:  100-125  gm  Fluid:  >/= 2.5L  EDUCATION NEEDS:   Education needs addressed  Herbert Anis. Kensie Susman, MS, RD LDN Inpatient Clinical Dietitian Pager [phone]

## 2016-06-03 NOTE — Progress Notes (Signed)
PROGRESS NOTE    Herbert Smith  LDJ:[id number] DOB: [birth year] DOA: 06/02/2016 PCP: Benito Mccreedy, MD   Chief Complaint  Patient presents with  . Back Pain    Brief Narrative:  HPI on 06/02/2016 by Dr. Flonnie Overman Dhungel Luan Herbert Smith  is a 60 y.o. male, With history of metastatic non-small cell lung cancer, squamosal carcinoma diagnosed as stage IIIB in March 2011 in [place] and underwent induction chemotherapy with carboplatin and paclitaxel followed by chemoradiation in July 2013 with stable disease. He also underwent decompressive T11-L1 laminectomies, right T11-12 never division and T11-L1 posterior spinal fusion in June 2015. The pathology showed poorly differentiated adenocarcinoma with focal squamous features. He also underwent CyberKnife radiosurgery to the vertebral body tumor in August 2015. He is following up with Dr. Julien Nordmann and being observed for a stable disease. Over the past few weeks patient was having increased low back pain. Patient reports having CT of his abdomen and pelvis 2 months back which was concerning for a tumor in the spine. He was referred to a spinal surgeon in [facility] who ordered an MRI of his thoracic and lumbar spine which showed metastatic disease of L1. No cord compression was noted. Over the past 2 weeks his low back pain has been worsened radiating to right back and groin. Denies radiation of pain to his thighs or legs. His home medications are not effectively controlling his pain. Denies any urinary retention or bowel incontinence (patient does inform that he has some urinary hesitancy for the past few weeks). Patient went to see his spinal surgeon for his MRI result last week and was started on 5-7 days of prednisone taper which she completed 2 days back. He was then referred to Dr. Julien Nordmann . Patient called Dr. Earlie Server and was asked to come to ED for observation. Vitals in the ED including labs were stable  Assessment & Plan   Metastatic  non-small cell lung cancer/squamous cell carcinoma with history of metastatic bone disease -Supposedly patient has new L1 metastatic disease. He was admitted for observation. -Admitting physician, discussed patient with Dr. Kathyrn Sheriff and reviewed MRI findings that were done at outside facility. Since there was no cord involvement noted, did not require neurosurgical intervention at this time. -Oncology consulted and appreciated, Dr. Earlie Server consulted radiation oncology. Continue Decadron and pain control at this time.  Tobacco abuse -Smoking cessation discussed.  DVT Prophylaxis  lovenox  Code Status: Full  Family Communication: None at bedside  Disposition Plan: In observation. Pending further recommendations from radiation oncology and oncology.  Consultants Oncology Radiation oncology Neurosurgery, via phone  Procedures  None  Antibiotics   Anti-infectives    None      Subjective:   [name] seen and examined today.  Patient continues to have back pain. Denies any chest pain, shortness breath, abdominal pain, nausea or vomiting, loss of urinary or bowel control.  Objective:   Vitals:   06/02/16 2116 06/02/16 2141 06/03/16 0544 06/03/16 1028  BP:  113/65 108/74   Pulse:  (!) 52 (!) 50   Resp:  16 16   Temp:  98.1 F (36.7 C) 98.3 F (36.8 C)   TempSrc:  Oral Oral   SpO2: 98% 97% 98% 98%  Weight:      Height:       No intake or output data in the 24 hours ending 06/03/16 1219 Filed Weights   06/02/16 1713 06/02/16 1821  Weight: 83 kg (183 lb) 83 kg (182 lb 15.7 oz)  Exam  General: Well developed, thin, no distress  HEENT: NCAT,  mucous membranes moist.   Cardiovascular: S1 S2 auscultated, no rubs, murmurs or gallops. Regular rate and rhythm.  Respiratory: Clear to auscultation bilaterally with equal chest rise  Musculoskeletal: Noted bulging of lumbar spine particular to the right. TTP  Abdomen: Soft, nontender, nondistended, + bowel  sounds  Extremities: warm dry without cyanosis clubbing or edema  Neuro: AAOx3, nonfocal  Psych: Normal affect and demeanor with intact judgement and insight   Data Reviewed: I have personally reviewed following labs and imaging studies  CBC:  Recent Labs Lab 06/02/16 1502 06/02/16 1512  WBC 6.4  --   NEUTROABS 3.0  --   HGB 13.9 15.6  HCT 43.1 46.0  MCV 87.6  --   PLT 258  --    Basic Metabolic Panel:  Recent Labs Lab 06/02/16 1502 06/02/16 1512  NA 138 140  K 4.3 4.2  CL 100* 97*  CO2 31  --   GLUCOSE 82 80  BUN 22* 26*  CREATININE 0.88 0.80  CALCIUM 10.2  --    GFR: Estimated Creatinine Clearance: 113.8 mL/min (by C-G formula based on SCr of 0.8 mg/dL). Liver Function Tests:  Recent Labs Lab 06/02/16 1502  AST 24  ALT 28  ALKPHOS 61  BILITOT 0.5  PROT 8.0  ALBUMIN 4.3   No results for input(s): LIPASE, AMYLASE in the last 168 hours. No results for input(s): AMMONIA in the last 168 hours. Coagulation Profile: No results for input(s): INR, PROTIME in the last 168 hours. Cardiac Enzymes: No results for input(s): CKTOTAL, CKMB, CKMBINDEX, TROPONINI in the last 168 hours. BNP (last 3 results) No results for input(s): PROBNP in the last 8760 hours. HbA1C: No results for input(s): HGBA1C in the last 72 hours. CBG:  Recent Labs Lab 06/02/16 1840 06/02/16 2138 06/03/16 0810 06/03/16 1154  GLUCAP 73 108* 142* 127*   Lipid Profile: No results for input(s): CHOL, HDL, LDLCALC, TRIG, CHOLHDL, LDLDIRECT in the last 72 hours. Thyroid Function Tests: No results for input(s): TSH, T4TOTAL, FREET4, T3FREE, THYROIDAB in the last 72 hours. Anemia Panel: No results for input(s): VITAMINB12, FOLATE, FERRITIN, TIBC, IRON, RETICCTPCT in the last 72 hours. Urine analysis:    Component Value Date/Time   COLORURINE AMBER (A) 11/14/2015 2111   APPEARANCEUR CLEAR 11/14/2015 2111   LABSPEC 1.023 11/14/2015 2111   PHURINE 5.5 11/14/2015 2111   GLUCOSEU  NEGATIVE 11/14/2015 2111   HGBUR SMALL (A) 11/14/2015 2111   BILIRUBINUR NEGATIVE 11/14/2015 2111   KETONESUR NEGATIVE 11/14/2015 2111   PROTEINUR NEGATIVE 11/14/2015 2111   NITRITE NEGATIVE 11/14/2015 2111   LEUKOCYTESUR NEGATIVE 11/14/2015 2111   Sepsis Labs: '@LABRCNTIP'$ (procalcitonin:4,lacticidven:4)  )No results found for this or any previous visit (from the past 240 hour(s)).    Radiology Studies: No results found.   Scheduled Meds: . dexamethasone  4 mg Oral Q6H  . enoxaparin (LOVENOX) injection  40 mg Subcutaneous Q24H  . feeding supplement (ENSURE ENLIVE)  237 mL Oral TID BM  . gabapentin  300 mg Oral TID  . insulin aspart  0-9 Units Subcutaneous TID WC  . pantoprazole  40 mg Oral Daily  . senna-docusate  2 tablet Oral BID  . tiotropium  18 mcg Inhalation BID   Continuous Infusions:    LOS: 0 days   Time Spent in minutes   30 minutes  Doshia Dalia D.O. on 06/03/2016 at 12:19 PM  Between 7am to 7pm - Pager - [phone]  After 7pm go to [email or web address] - password [email or web address]  And look for the night coverage person covering for me after hours  [facility] Office  [phone]

## 2016-06-03 NOTE — Progress Notes (Signed)
Subjective: The patient is seen and examined today. He is a very pleasant 60 years old African-American male with metastatic non-small cell lung cancer, squamous cell carcinoma. He is status post induction chemotherapy with carboplatin and paclitaxel followed by concurrent chemoradiation completed in July 2016 in [place]. He also had evidence of metastatic bone disease and he underwent T11-81 laminectomies with posterior spinal fusion on 03/18/2014 in addition to CyberKnife radiosurgery to the vertebral body tumor in early August 2015. He most to [facility] more than 2 years ago and has been observation since that time. Over the last few weeks he has been complaining of increasing back pain and he was seen at [facility]. MRI of the cervical lumbar spine showed concerning evidence of epidural tumor extension in the area of the previous surgery. The patient was admitted for further evaluation and management of his condition. He was started on Decadron and feeling a little bit better today. He has also been on chronic pain medications. He denied having any other significant complaints today.  Objective: Vital signs in last 24 hours: Temp:  [97.5 F (36.4 C)-98.3 F (36.8 C)] 98.3 F (36.8 C) (09/13 0544) Pulse Rate:  [50-52] 50 (09/13 0544) Resp:  [16-17] 16 (09/13 0544) BP: (108-118)/(65-76) 108/74 (09/13 0544) SpO2:  [94 %-100 %] 98 % (09/13 0544) Weight:  [182 lb 15.7 oz (83 kg)-183 lb (83 kg)] 182 lb 15.7 oz (83 kg) (09/12 1821)  Intake/Output from previous day: No intake/output data recorded. Intake/Output this shift: No intake/output data recorded.  General appearance: alert, cooperative and no distress Resp: clear to auscultation bilaterally Cardio: regular rate and rhythm, S1, S2 normal, no murmur, click, rub or gallop GI: soft, non-tender; bowel sounds normal; no masses,  no organomegaly Extremities: extremities normal, atraumatic, no cyanosis  or edema  Lab Results:   Recent Labs  06/02/16 1502 06/02/16 1512  WBC 6.4  --   HGB 13.9 15.6  HCT 43.1 46.0  PLT 258  --    BMET  Recent Labs  06/02/16 1502 06/02/16 1512  NA 138 140  K 4.3 4.2  CL 100* 97*  CO2 31  --   GLUCOSE 82 80  BUN 22* 26*  CREATININE 0.88 0.80  CALCIUM 10.2  --     Studies/Results: No results found.  Medications: I have reviewed the patient's current medications.  CODE STATUS: Full code  Assessment/Plan: This is a very pleasant 60 years old African-American male with metastatic non-small cell lung cancer, squamous cell carcinoma with history of metastatic bone disease to the T11-L1 status post laminectomies can CyberKnife treatment to that area and 2015. His recent imaging studies including CT scan of the chest, abdomen and pelvis showed no evidence for disease progression with recent MRI of the thoracolumbar spine showed questionable concerning epidural extension at the T11-L1 area. I had a lengthy discussion with the patient about his condition. I will consult radiation oncology for evaluation of his condition and to see if the patient is a candidate for palliative radiotherapy. We may also need consultation with neurosurgery to rule out any surgical intervention for his condition. We'll continue his Decadron and pain medication for now. Thank you so much for taking good care of Mr. Schnoebelen, I will continue to follow up the patient with you and assist in his management on as-needed basis.   LOS: 0 days    Gunther Zawadzki K. 06/03/2016

## 2016-06-04 ENCOUNTER — Other Ambulatory Visit: Payer: Self-pay | Admitting: *Deleted

## 2016-06-04 LAB — GLUCOSE, CAPILLARY
Glucose-Capillary: 119 mg/dL — ABNORMAL HIGH (ref 65–99)
Glucose-Capillary: 110 mg/dL — ABNORMAL HIGH (ref 65–99)

## 2016-06-04 MED ORDER — OXYCODONE HCL ER 15 MG PO T12A
30.0000 mg | EXTENDED_RELEASE_TABLET | Freq: Two times a day (BID) | ORAL | Status: DC
  Administered 2016-06-04 – 2016-06-05 (×3): 30 mg via ORAL
  Filled 2016-06-04 (×3): qty 2

## 2016-06-04 MED ORDER — CYCLOBENZAPRINE HCL 5 MG PO TABS
5.0000 mg | ORAL_TABLET | Freq: Three times a day (TID) | ORAL | Status: DC | PRN
  Administered 2016-06-04: 5 mg via ORAL
  Filled 2016-06-04: qty 1

## 2016-06-04 MED ORDER — OXYCODONE HCL 5 MG PO TABS
10.0000 mg | ORAL_TABLET | ORAL | Status: DC | PRN
  Administered 2016-06-04 – 2016-06-05 (×2): 10 mg via ORAL
  Filled 2016-06-04 (×2): qty 2

## 2016-06-04 NOTE — Progress Notes (Addendum)
PROGRESS NOTE    Herbert Smith  MOQ:[id number] DOB: [birth year] DOA: 06/02/2016 PCP: Benito Mccreedy, MD   Chief Complaint  Patient presents with  . Back Pain    Brief Narrative:  HPI on 06/02/2016 by Dr. Flonnie Overman Dhungel Ascher Schroepfer  is a 60 y.o. male, With history of metastatic non-small cell lung cancer, squamosal carcinoma diagnosed as stage IIIB in March 2011 in [place] and underwent induction chemotherapy with carboplatin and paclitaxel followed by chemoradiation in July 2013 with stable disease. He also underwent decompressive T11-L1 laminectomies, right T11-12 never division and T11-L1 posterior spinal fusion in June 2015. The pathology showed poorly differentiated adenocarcinoma with focal squamous features. He also underwent CyberKnife radiosurgery to the vertebral body tumor in August 2015. He is following up with Dr. Julien Nordmann and being observed for a stable disease. Over the past few weeks patient was having increased low back pain. Patient reports having CT of his abdomen and pelvis 2 months back which was concerning for a tumor in the spine. He was referred to a spinal surgeon in [facility] who ordered an MRI of his thoracic and lumbar spine which showed metastatic disease of L1. No cord compression was noted. Over the past 2 weeks his low back pain has been worsened radiating to right back and groin. Denies radiation of pain to his thighs or legs. His home medications are not effectively controlling his pain. Denies any urinary retention or bowel incontinence (patient does inform that he has some urinary hesitancy for the past few weeks). Patient went to see his spinal surgeon for his MRI result last week and was started on 5-7 days of prednisone taper which she completed 2 days back. He was then referred to Dr. Julien Nordmann . Patient called Dr. Earlie Server and was asked to come to ED for observation. Vitals in the ED including labs were stable  Interim history Pending medical  records from [place] before more decisions can be made regarding radiation therapy.  Palliative care consulted for pain management.  Assessment & Plan   Metastatic non-small cell lung cancer/squamous cell carcinoma with history of metastatic bone disease -Supposedly patient has new L1 metastatic disease. He was admitted for observation. -Admitting physician, discussed patient with Dr. Kathyrn Sheriff and reviewed MRI findings that were done at outside facility. Since there was no cord involvement noted, did not require neurosurgical intervention at this time. -Oncology consulted and appreciated, Dr. Earlie Server consulted radiation oncology. Continue Decadron and pain control at this time. -Spoke with radiation oncology, plan to obtain records from [facility] and recent MRI.   -Will consult palliative care for pain management. (patient admits to itchiness with morphine and fentanyl in the past)  Tobacco abuse -Smoking cessation discussed.  DVT Prophylaxis  lovenox  Code Status: Full  Family Communication: None at bedside  Disposition Plan: Admitted. Pending further recommendations from radiation oncology and oncology.  Consultants Oncology Radiation oncology Neurosurgery, via phone (Dr. Karma Lew. Dhungel) Palliative care  Procedures  None  Antibiotics   Anti-infectives    None      Subjective:   Herbert Smith seen and examined today.  Patient continues to have back pain.  Feels weak. Denies any chest pain, shortness breath, abdominal pain, nausea or vomiting, loss of urinary or bowel control.  Objective:   Vitals:   06/03/16 1028 06/03/16 1350 06/03/16 2117 06/04/16 0530  BP:  112/70 122/83 127/77  Pulse:  71 73 (!) 55  Resp:  '18 18 16  '$ Temp:  98.1 F (36.7 C) 98.5 F (  36.9 C) 98.2 F (36.8 C)  TempSrc:  Oral Oral Oral  SpO2: 98% 97% 98% 95%  Weight:      Height:        Intake/Output Summary (Last 24 hours) at 06/04/16 1116 Last data filed at 06/03/16 2118   Gross per 24 hour  Intake              475 ml  Output                0 ml  Net              475 ml   Filed Weights   06/02/16 1713 06/02/16 1821  Weight: 83 kg (183 lb) 83 kg (182 lb 15.7 oz)    Exam  General: Well developed, thin, no apparent distress  HEENT: NCAT,  mucous membranes moist.   Cardiovascular: S1 S2 auscultated,RRR, no murmurs  Respiratory: Clear to auscultation bilaterally with equal chest rise  Musculoskeletal: Noted bulging of lumbar spine particular to the right. TTP  Abdomen: Soft, nontender, nondistended, + bowel sounds  Extremities: warm dry without cyanosis clubbing or edema  Neuro: AAOx3, nonfocal  Psych: Normal affect and demeanor with intact judgement and insight   Data Reviewed: I have personally reviewed following labs and imaging studies  CBC:  Recent Labs Lab 06/02/16 1502 06/02/16 1512  WBC 6.4  --   NEUTROABS 3.0  --   HGB 13.9 15.6  HCT 43.1 46.0  MCV 87.6  --   PLT 258  --    Basic Metabolic Panel:  Recent Labs Lab 06/02/16 1502 06/02/16 1512  NA 138 140  K 4.3 4.2  CL 100* 97*  CO2 31  --   GLUCOSE 82 80  BUN 22* 26*  CREATININE 0.88 0.80  CALCIUM 10.2  --    GFR: Estimated Creatinine Clearance: 113.8 mL/min (by C-G formula based on SCr of 0.8 mg/dL). Liver Function Tests:  Recent Labs Lab 06/02/16 1502  AST 24  ALT 28  ALKPHOS 61  BILITOT 0.5  PROT 8.0  ALBUMIN 4.3   No results for input(s): LIPASE, AMYLASE in the last 168 hours. No results for input(s): AMMONIA in the last 168 hours. Coagulation Profile: No results for input(s): INR, PROTIME in the last 168 hours. Cardiac Enzymes: No results for input(s): CKTOTAL, CKMB, CKMBINDEX, TROPONINI in the last 168 hours. BNP (last 3 results) No results for input(s): PROBNP in the last 8760 hours. HbA1C: No results for input(s): HGBA1C in the last 72 hours. CBG:  Recent Labs Lab 06/03/16 0810 06/03/16 1154 06/03/16 1620 06/03/16 2121 06/04/16 0755   GLUCAP 142* 127* 156* 96 119*   Lipid Profile: No results for input(s): CHOL, HDL, LDLCALC, TRIG, CHOLHDL, LDLDIRECT in the last 72 hours. Thyroid Function Tests: No results for input(s): TSH, T4TOTAL, FREET4, T3FREE, THYROIDAB in the last 72 hours. Anemia Panel: No results for input(s): VITAMINB12, FOLATE, FERRITIN, TIBC, IRON, RETICCTPCT in the last 72 hours. Urine analysis:    Component Value Date/Time   COLORURINE AMBER (A) 11/14/2015 2111   APPEARANCEUR CLEAR 11/14/2015 2111   LABSPEC 1.023 11/14/2015 2111   PHURINE 5.5 11/14/2015 2111   GLUCOSEU NEGATIVE 11/14/2015 2111   HGBUR SMALL (A) 11/14/2015 2111   BILIRUBINUR NEGATIVE 11/14/2015 2111   KETONESUR NEGATIVE 11/14/2015 2111   PROTEINUR NEGATIVE 11/14/2015 2111   NITRITE NEGATIVE 11/14/2015 2111   LEUKOCYTESUR NEGATIVE 11/14/2015 2111   Sepsis Labs: '@LABRCNTIP'$ (procalcitonin:4,lacticidven:4)  )No results found for this or any previous visit (  from the past 240 hour(s)).    Radiology Studies: No results found.   Scheduled Meds: . dexamethasone  4 mg Oral Q6H  . enoxaparin (LOVENOX) injection  40 mg Subcutaneous Q24H  . feeding supplement (ENSURE ENLIVE)  237 mL Oral TID BM  . gabapentin  300 mg Oral TID  . insulin aspart  0-9 Units Subcutaneous TID WC  . pantoprazole  40 mg Oral Daily  . senna-docusate  2 tablet Oral BID  . tiotropium  18 mcg Inhalation BID   Continuous Infusions:    LOS: 1 day   Time Spent in minutes   30 minutes  Lindzie Boxx D.O. on 06/04/2016 at 11:16 AM  Between 7am to 7pm - Pager - [phone]  After 7pm go to [email or web address] - password [email or web address]  And look for the night coverage person covering for me after hours  [facility] Office  [phone]

## 2016-06-04 NOTE — Consult Note (Signed)
Radiation Oncology         [phone] ________________________________  Name: Herbert Smith MRN: [id number]  Date: 06/03/16  DOB: 25-Nov-1955  EH:[id number],IBBCWU, MD  No ref. provider found     REFERRING PHYSICIAN: No ref. provider found   DIAGNOSIS: There were no encounter diagnoses.   HISTORY OF PRESENT ILLNESS: Herbert Smith is a 60 y.o. male seen at the request of Dr. Ree Kida for progressive back pain  Consistent with metastatic disease with a history of lung cancer. The patient used to reside in [place]. 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 He received carboplatin and paclitaxel followed by a course of concurrent chemoradiation with carboplatin and paclitaxel completed in July 2013 with stable disease in the chest. This was under the care of Dr. Bradly Chris. He was found to have recurrent disease in 2015 when he presented with a T12 vertebral body metastasis and cord compression. He underwent T11-L1 laminectomy with right T11-12 nerve root division, and T11-L1 posterior spinal fusion on 03/18/14. He subsequently was treated with Dr. Tacey Heap at [facility] in radiation oncology with cyberknife therapy in 5 fractions.   He relocated to [facility] about two years ago and has been followed in observation with Dr. Julien Nordmann. He continued to have low back pain following his procedure requiring narcotics, but presented to a spine specialist office because of progressive back pain in the thoracic and high lumbar region. An outside MRI revealed concerns for recurrent disease within the T11-L1 vertebral bodies, and pathologic fracture of T12, with epidural soft tissue extending from T11-L1. He was ultimately admitted for management of back pain, and we are asked to consult on whether there is any feasibility for additional radiotherapy.    PREVIOUS RADIATION THERAPY: Yes   04/23/14-05/01/14: 60 Gy to T12 vertebral body in 5  fractions  July 2013: Definitive Radiotherapy to the chest    PAST MEDICAL HISTORY:  Past Medical History:  Diagnosis Date  . Cancer ([place])    lung ca dx'd 2011  . Primary cancer of right upper lobe of lung ([place]) 09/12/2014       PAST SURGICAL HISTORY: Past Surgical History:  Procedure Laterality Date  . LAMINECTOMY  03/18/2014   T11-L1 laminectomy with right T11-12 nerve root division, and T11-L1 posterior spinal fusion     FAMILY HISTORY: History reviewed. No pertinent family history.   SOCIAL HISTORY:  reports that he has been smoking Cigarettes.  He has a 22.50 pack-year smoking history. He has never used smokeless tobacco. He reports that he does not drink alcohol or use drugs.   ALLERGIES: Fentanyl   MEDICATIONS:  Current Facility-Administered Medications  Medication Dose Route Frequency Provider Last Rate Last Dose  . acetaminophen (TYLENOL) tablet 650 mg  650 mg Oral Q6H PRN Nishant Dhungel, MD       Or  . acetaminophen (TYLENOL) suppository 650 mg  650 mg Rectal Q6H PRN Nishant Dhungel, MD      . albuterol (PROVENTIL) (2.5 MG/3ML) 0.083% nebulizer solution 2.5 mg  2.5 mg Nebulization Q6H PRN Nishant Dhungel, MD      . albuterol (PROVENTIL) (2.5 MG/3ML) 0.083% nebulizer solution 3 mL  3 mL Inhalation Q6H PRN Nishant Dhungel, MD      . bisacodyl (DULCOLAX) EC tablet 5 mg  5 mg Oral Daily PRN Nishant Dhungel, MD      . bisacodyl (DULCOLAX) EC tablet 5 mg  5 mg Oral Daily PRN Nishant Dhungel,  MD      . cyclobenzaprine (FLEXERIL) tablet 5 mg  5 mg Oral TID PRN Cristal Ford, DO   5 mg at 06/04/16 0826  . dexamethasone (DECADRON) tablet 4 mg  4 mg Oral Q6H Nishant Dhungel, MD   4 mg at 06/05/16 0622  . diphenhydrAMINE (BENADRYL) capsule 50 mg  50 mg Oral QHS PRN Nishant Dhungel, MD      . enoxaparin (LOVENOX) injection 40 mg  40 mg Subcutaneous Q24H Nishant Dhungel, MD   40 mg at 06/04/16 2314  . feeding supplement (ENSURE ENLIVE) (ENSURE ENLIVE) liquid 237 mL  237 mL  Oral TID BM Maryann Mikhail, DO   237 mL at 06/04/16 2013  . gabapentin (NEURONTIN) capsule 300 mg  300 mg Oral TID Nishant Dhungel, MD   300 mg at 06/04/16 2313  . HYDROmorphone (DILAUDID) injection 1 mg  1 mg Intravenous Q3H PRN Micheline Rough, MD   1 mg at 06/05/16 2426  . insulin aspart (novoLOG) injection 0-9 Units  0-9 Units Subcutaneous TID WC Nishant Dhungel, MD   2 Units at 06/03/16 1643  . ketorolac (TORADOL) 30 MG/ML injection 30 mg  30 mg Intravenous Q6H PRN Nishant Dhungel, MD      . ketorolac (TORADOL) 30 MG/ML injection 30 mg  30 mg Intravenous Q6H PRN Nishant Dhungel, MD      . ondansetron (ZOFRAN) tablet 4 mg  4 mg Oral Q6H PRN Nishant Dhungel, MD       Or  . ondansetron (ZOFRAN) injection 4 mg  4 mg Intravenous Q6H PRN Nishant Dhungel, MD      . oxyCODONE (Oxy IR/ROXICODONE) immediate release tablet 10 mg  10 mg Oral Q3H PRN Micheline Rough, MD   10 mg at 06/05/16 0522  . oxyCODONE (OXYCONTIN) 12 hr tablet 30 mg  30 mg Oral Q12H Gene Freeman, MD   30 mg at 06/04/16 2313  . pantoprazole (PROTONIX) EC tablet 40 mg  40 mg Oral Daily Nishant Dhungel, MD   40 mg at 06/04/16 1024  . senna-docusate (Senokot-S) tablet 2 tablet  2 tablet Oral BID Louellen Molder, MD   2 tablet at 06/04/16 2313  . [START ON 06/06/2016] tiotropium (SPIRIVA) inhalation capsule 18 mcg  18 mcg Inhalation Daily Maryann Mikhail, DO         REVIEW OF SYSTEMS: On review of systems, the patient reports that he is not feeling great and reports minimal pain relief with the dilaudid currently. He describes sharp pain in his mid back along his surgical site. He also describes a contour abnormality of the spine in the region of his surgery that has become more noticeable, and the tissue projects out where previously this did not. He notes his pain radiates around his iliac regions and bilaterally into the groins. This is more painful left versus right. He denies any activity that brings on his pain. He describes about 15 pounds  of weight loss in the last few months. He  denies any chest pain, shortness of breath, cough, fevers, chills, night sweats. He denies any bowel or bladder disturbances or incontinent episodes, and denies abdominal pain, nausea or vomiting. He denies any headaches, blurry vision, or auditory disturbances. He does admit to feeling weak but denies falls or gait disturbances. A complete review of systems is obtained and is otherwise negative.     PHYSICAL EXAM:  height is '6\' 8"'$  (2.032 m) and weight is 182 lb 15.7 oz (83 kg). His oral temperature is 98.1 F (  36.7 C). His blood pressure is 129/71 and his pulse is 47 (abnormal). His respiration is 14 and oxygen saturation is 97%.   Pain scale 10/10 In general this is an uncomfortable African American gentleman in no acute distress. He is alert and oriented x4 and appropriate throughout the examination. HEENT reveals that the patient is normocephalic, atraumatic. EOMs are intact. PERRLA. Skin is intact without any evidence of gross lesions.  Cardiopulmonary assessment is negative for acute distress and he exhibits normal effort. Evaluation of the posterior thorax revealed a midline laminectomy incision running from approximately T10-L2 region. There is projection of the spine seen below. He has palpable tenderness in the lower third of the incision site. He is neurologically intact in the upper and lower extremities with 5/5 strength and intact soft touch bilaterally.    ECOG = 3  0 - Asymptomatic (Fully active, able to carry on all predisease activities without restriction)  1 - Symptomatic but completely ambulatory (Restricted in physically strenuous activity but ambulatory and able to carry out work of a light or sedentary nature. For example, light housework, office work)  2 - Symptomatic, <50% in bed during the day (Ambulatory and capable of all self care but unable to carry out any work activities. Up and about more than 50% of waking hours)  3 -  Symptomatic, >50% in bed, but not bedbound (Capable of only limited self-care, confined to bed or chair 50% or more of waking hours)  4 - Bedbound (Completely disabled. Cannot carry on any self-care. Totally confined to bed or chair)  5 - Death   Eustace Pen MM, Creech RH, Tormey DC, et al. [phone]). "Toxicity and response criteria of the [facility]". [place] Oncol. 5 (6): 649-55    LABORATORY DATA:  Lab Results  Component Value Date   WBC 12.4 (H) 06/05/2016   HGB 12.9 (L) 06/05/2016   HCT 39.1 06/05/2016   MCV 86.1 06/05/2016   PLT 221 06/05/2016   Lab Results  Component Value Date   NA 136 06/05/2016   K 4.2 06/05/2016   CL 99 (L) 06/05/2016   CO2 30 06/05/2016   Lab Results  Component Value Date   ALT 28 06/02/2016   AST 24 06/02/2016   ALKPHOS 61 06/02/2016   BILITOT 0.5 06/02/2016      RADIOGRAPHY: No results found.     IMPRESSION/PLAN: 1. 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 The patient's prior therapy has been reviewed per Dr. Worthy Flank notes. We will contact radiation oncology at [facility] to obtain his records from his prior treatments. Dosimetry data and images from treatment as well as dose will determine ability to re-treat this patient for palliation of his symptoms. We will plan a PET scan following discharge from the hospital as well to re-stage him, and defer additional palliative systemic options to Dr. Julien Nordmann.  2. Back pain secondary to #1. The patient has not had good success in the past with narcotics due to pruritis. I discussed the rationale for a palliative care consult to help consider other options including methadone for his cancer pain.  3. Goals of care. Given his metastatic disease, he understands that all treatments moving forward would be palliative in nature. He is also interested in speaking with palliative care to discuss goals of care.      Carola Rhine, PAC    Addendum  06/04/16: I have collected records from the patient's prior treatment from Dr. Tacey Heap  at [facility], we have dosimetry records as well. Dr. Lisbeth Renshaw has reviewed and would like to discuss the patient's case with radiology, neurosurgery and [facility] colleagues. It appears that there may still be some room to treat him with SRS, we will confirm if this remains an option, and recommend continuation of dexamethasone and pain medication. We will work with palliative care to achieve better pain control so that the patient may discharge and proceed with treatments (again if possible) as an outpt.      Carola Rhine, PAC

## 2016-06-04 NOTE — Consult Note (Signed)
Consultation Note Date: 06/04/2016   Patient Name: Herbert Smith  DOB: 23-Jun-1956  MRN: [id number]  Age / Sex: 60 y.o., male  PCP: Benito Mccreedy, MD Referring Physician: Cristal Ford, DO  Reason for Consultation: Pain management  HPI/Patient Profile: 60 y.o. male  with past medical history of metastatic non-small cell lung cancer, squamous cell carcinoma admitted on 06/02/2016 with poorly controlled back pain and admitted by recommendation of oncologist Dr. Julien Nordmann for possible radiation therapy; recent MRI showed metastatic disease of L1. He has had chemotherapy and chemoradiation completed in 2016 as well as laminectomy and spinal fusion surgeries in 2015 with CyberKnife radiosurgery for spinal metastases. Back pain has been ongoing for years but acutely worse for the last 2 months.   Taking 20 mg oxycodone and tramadol at same time could "take the edge off" recently but he has not been prescribed enough medication to allow him to have adequate pain relief on a regular basis.  He admits taking more medication than was prescribed and also admits he has taken friends' prescription pain medication, "I was desperate for some relief" in both cases.   He has a remote history of cocaine abuse (tried once in setting of trying to achieve pain relief from cancer several years ago) and has tried marijuana, which calms him and helps pain somewhat, but no marijuana within last month.   Clinical Assessment and Goals of Care: Patient with poorly controlled back pain in setting of spinal metastasis with history of lung cancer. Patient acknowledges that he feels better and has less pain when he is able to be active but has not been able to be as active as he would like due to pain. Pain radiates to right lower back and groin. Pain is worsened by most activities--walking, changing positions. He says he recently had to clear a  bottom shelf at [employer] to sit while accompanying a friend running errands because pain became too severe to continue shopping. He hopes to have pain better controlled in order to lead a more active life.   SUMMARY OF RECOMMENDATIONS   Recommend increasing pain regimen, including addition of long-acting medication.   Code Status/Advance Care Planning:  Full code   Symptom Management:   Recommend scheduled 30 mg oxycontin BID with 10 mg oxycodone q3h prn, based on 24 hour usage of dilaudid and oxycodone since admission.  Recommend PRN IV dilaudid 1 mg q3h prn for severe breakthrough pain if oral route ineffective while admitted. Will monitor use and up-titrate oxycontin/oxycodone recommendations as needed.  Pending Radiation Oncology's recommendations for treatment, expect their team will be following patient long-term and would be in best position to manage patient's pain on discharge.   Prognosis:   Unable to determine, active metastatic disease to bone  Discharge Planning: To Be Determined      Primary Diagnoses: Present on Admission: . Metastatic cancer to spine ([place]) . Low back pain at multiple sites . Low back pain  I have reviewed the medical record, interviewed the patient and family, and examined  the patient. The following aspects are pertinent.  Past Medical History:  Diagnosis Date  . Cancer ([place])    lung ca dx'd 2011  . Primary cancer of right upper lobe of lung ([place]) 09/12/2014   Social History   Social History  . Marital status: Single    Spouse name: N/A  . Number of children: N/A  . Years of education: N/A   Social History Main Topics  . Smoking status: Current Every Day Smoker    Packs/day: 1.50    Years: 15.00    Types: Cigarettes  . Smokeless tobacco: Never Used  . Alcohol use No  . Drug use: No  . Sexual activity: Not Asked   Other Topics Concern  . None   Social History Narrative  . None   History reviewed. No pertinent family  history. Scheduled Meds: . dexamethasone  4 mg Oral Q6H  . enoxaparin (LOVENOX) injection  40 mg Subcutaneous Q24H  . feeding supplement (ENSURE ENLIVE)  237 mL Oral TID BM  . gabapentin  300 mg Oral TID  . insulin aspart  0-9 Units Subcutaneous TID WC  . oxyCODONE  30 mg Oral Q12H  . pantoprazole  40 mg Oral Daily  . senna-docusate  2 tablet Oral BID  . tiotropium  18 mcg Inhalation BID   Continuous Infusions:  PRN Meds:.acetaminophen **OR** acetaminophen, albuterol, albuterol, bisacodyl, bisacodyl, cyclobenzaprine, diphenhydrAMINE, HYDROmorphone (DILAUDID) injection, ketorolac, ketorolac, ondansetron **OR** ondansetron (ZOFRAN) IV, oxyCODONE Medications Prior to Admission:  Prior to Admission medications   Medication Sig Start Date End Date Taking? Authorizing Provider  albuterol (PROVENTIL HFA;VENTOLIN HFA) 108 (90 BASE) MCG/ACT inhaler Inhale 2 puffs into the lungs every 6 (six) hours as needed for wheezing or shortness of breath.   Yes Historical Provider, MD  albuterol (PROVENTIL) (2.5 MG/3ML) 0.083% nebulizer solution Take 2.5 mg by nebulization every 6 (six) hours as needed for wheezing or shortness of breath.   Yes Historical Provider, MD  bisacodyl (DULCOLAX) 5 MG EC tablet Take 5 mg by mouth daily as needed for moderate constipation.   Yes Historical Provider, MD  diphenhydrAMINE (BENADRYL) 25 MG tablet Take 50 mg by mouth at bedtime as needed for sleep.   Yes Historical Provider, MD  gabapentin (NEURONTIN) 300 MG capsule Take 300 mg by mouth 3 (three) times daily.   Yes Historical Provider, MD  oxyCODONE (OXY IR/ROXICODONE) 5 MG immediate release tablet Take 1 tablet (5 mg total) by mouth every 6 (six) hours as needed for severe pain. 05/28/16  Yes Curt Bears, MD  tiotropium (SPIRIVA) 18 MCG inhalation capsule Place 18 mcg into inhaler and inhale 2 (two) times daily.   Yes Historical Provider, MD  traMADol (ULTRAM) 50 MG tablet Take 50 mg by mouth every 6 (six) hours as needed  for moderate pain.   Yes Historical Provider, MD   Allergies  Allergen Reactions  . Fentanyl Itching   Review of Systems: No bowel or bladder incontinence  Physical Exam  General: Patient uncomfortable, shifting positions frequently Lungs: No increased WOB  Vital Signs: BP 134/79 (BP Location: Left Arm)   Pulse (!) 47   Temp 97.8 F (36.6 C) (Oral)   Resp 16   Ht '6\' 8"'$  (2.032 m)   Wt 83 kg (182 lb 15.7 oz)   SpO2 100%   BMI 20.10 kg/m  Pain Assessment: 0-10   Pain Score: 4    SpO2: SpO2: 100 % O2 Device:SpO2: 100 % O2 Flow Rate: .   IO: Intake/output  summary:   Intake/Output Summary (Last 24 hours) at 06/04/16 1559 Last data filed at 06/04/16 1359  Gross per 24 hour  Intake              715 ml  Output                0 ml  Net              715 ml    LBM: Last BM Date: 06/01/16 Baseline Weight: Weight: 83 kg (183 lb) Most recent weight: Weight: 83 kg (182 lb 15.7 oz)     Palliative Assessment/Data:   Flowsheet Rows   Flowsheet Row Most Recent Value  Intake Tab  Referral Department  Hospitalist  Date Notified  06/04/16  Palliative Care Type  New Palliative care  Reason for referral  Pain  Clinical Assessment  Palliative Performance Scale Score  60%  Psychosocial & Spiritual Assessment  Palliative Care Outcomes  Patient/Family meeting held?  No  Palliative Care Outcomes  Improved pain interventions      Time In: 1:30 Time Out: 2:40 Time Total: 70 minutes Greater than 50%  of this time was spent counseling and coordinating care related to the above assessment and plan.  Signed by: Darci Needle, MD [employer], PGY-2 Seen with Dr. Micheline Rough.   Please contact Palliative Medicine Team phone at [phone] for questions and concerns.  For individual provider: See Amion  I saw and examined Mr. Grzywacz in conjunction with Dr. Ola Spurr.  I was present for entire encounter and agree with assessment and plan as outlined  above. Micheline Rough, MD [facility] [phone]

## 2016-06-05 ENCOUNTER — Encounter (HOSPITAL_COMMUNITY): Payer: Self-pay | Admitting: Radiation Oncology

## 2016-06-05 DIAGNOSIS — K5903 Drug induced constipation: Secondary | ICD-10-CM

## 2016-06-05 DIAGNOSIS — G893 Neoplasm related pain (acute) (chronic): Secondary | ICD-10-CM

## 2016-06-05 DIAGNOSIS — C7951 Secondary malignant neoplasm of bone: Principal | ICD-10-CM

## 2016-06-05 DIAGNOSIS — T402X5A Adverse effect of other opioids, initial encounter: Secondary | ICD-10-CM

## 2016-06-05 DIAGNOSIS — Z515 Encounter for palliative care: Secondary | ICD-10-CM

## 2016-06-05 LAB — BASIC METABOLIC PANEL
Anion gap: 7 (ref 5–15)
BUN: 27 mg/dL — AB (ref 6–20)
CALCIUM: 10.1 mg/dL (ref 8.9–10.3)
CO2: 30 mmol/L (ref 22–32)
CREATININE: 0.96 mg/dL (ref 0.61–1.24)
Chloride: 99 mmol/L — ABNORMAL LOW (ref 101–111)
GFR calc Af Amer: >60 mL/min (ref >60)
GLUCOSE: 112 mg/dL — AB (ref 65–99)
POTASSIUM: 4.2 mmol/L (ref 3.5–5.1)
SODIUM: 136 mmol/L (ref 135–145)

## 2016-06-05 LAB — GLUCOSE, CAPILLARY
GLUCOSE-CAPILLARY: 108 mg/dL — AB (ref 65–99)
Glucose-Capillary: 111 mg/dL — ABNORMAL HIGH (ref 65–99)
Glucose-Capillary: 138 mg/dL — ABNORMAL HIGH (ref 65–99)

## 2016-06-05 LAB — CBC
HCT: 39.1 % (ref 39.0–52.0)
Hemoglobin: 12.9 g/dL — ABNORMAL LOW (ref 13.0–17.0)
MCH: 28.4 pg (ref 26.0–34.0)
MCHC: 33 g/dL (ref 30.0–36.0)
MCV: 86.1 fL (ref 78.0–100.0)
PLATELETS: 221 10*3/uL (ref 150–400)
RBC: 4.54 MIL/uL (ref 4.22–5.81)
RDW: 15.7 % — AB (ref 11.5–15.5)
WBC: 12.4 10*3/uL — ABNORMAL HIGH (ref 4.0–10.5)

## 2016-06-05 MED ORDER — ACETAMINOPHEN 325 MG PO TABS: 650.0000 mg | ORAL_TABLET | Freq: Four times a day (QID) | ORAL | 0 refills | Status: AC | PRN

## 2016-06-05 MED ORDER — SENNOSIDES-DOCUSATE SODIUM 8.6-50 MG PO TABS: 2.0000 | ORAL_TABLET | Freq: Two times a day (BID) | ORAL | 0 refills | Status: AC

## 2016-06-05 MED ORDER — BISACODYL 5 MG PO TBEC: 5.0000 mg | DELAYED_RELEASE_TABLET | Freq: Every day | ORAL | 1 refills | Status: AC | PRN

## 2016-06-05 MED ORDER — TIOTROPIUM BROMIDE MONOHYDRATE 18 MCG IN CAPS
18.0000 ug | ORAL_CAPSULE | Freq: Every day | RESPIRATORY_TRACT | Status: DC
  Filled 2016-06-05: qty 5

## 2016-06-05 MED ORDER — OXYCODONE HCL ER 30 MG PO T12A: 30.0000 mg | EXTENDED_RELEASE_TABLET | Freq: Two times a day (BID) | ORAL | 0 refills | Status: DC

## 2016-06-05 MED ORDER — ENSURE ENLIVE PO LIQD: 237.0000 mL | Freq: Three times a day (TID) | ORAL | 12 refills | Status: AC

## 2016-06-05 MED ORDER — OXYCODONE HCL 10 MG PO TABS: 10.0000 mg | ORAL_TABLET | ORAL | 0 refills | Status: DC | PRN

## 2016-06-05 MED ORDER — PANTOPRAZOLE SODIUM 40 MG PO TBEC: 40.0000 mg | DELAYED_RELEASE_TABLET | Freq: Every day | ORAL | 0 refills | Status: AC

## 2016-06-05 MED ORDER — DEXAMETHASONE 4 MG PO TABS: 4.0000 mg | ORAL_TABLET | Freq: Four times a day (QID) | ORAL | 0 refills | Status: DC

## 2016-06-05 MED ORDER — CYCLOBENZAPRINE HCL 5 MG PO TABS: 5.0000 mg | ORAL_TABLET | Freq: Three times a day (TID) | ORAL | 0 refills | Status: AC | PRN

## 2016-06-05 NOTE — Progress Notes (Signed)
Discharge instructions given, teach back utilized, verbalized understanding. Discussed follow up appointments, reminded him to call his MD. Smoking cessation hand out and discussions given, patient understood that smoking is hazardous to his health and will do his best to stop smoking. Pain is controlled. Stable.

## 2016-06-05 NOTE — Discharge Summary (Signed)
Discharge Summary  Herbert Smith KNL:[id number] DOB: October 23, 1955  PCP: Herbert Mccreedy, MD  Admit date: 06/02/2016 Discharge date: 06/05/2016   Recommendations for Outpatient Follow-up:  1. Radiation Oncology within 1 week, call for appointment 2. Oncology 3. PCP  Discharge Diagnoses:  Active Hospital Problems   Diagnosis Date Noted  . Tobacco abuse   . Metastatic cancer to spine ([place]) 06/02/2016  . Low back pain at multiple sites 06/02/2016  . Low back pain 06/02/2016    Resolved Hospital Problems   Diagnosis Date Noted Date Resolved  No resolved problems to display.    Discharge Condition: Stable   Diet recommendation: Regular   Vitals:   06/04/16 2120 06/05/16 0506  BP: 123/68 129/71  Pulse: (!) 54 (!) 47  Resp: 16 14  Temp: 97.8 F (36.6 C) 98.1 F (36.7 C)    History of present illness:  LarryWhartonis a 60 y.o.male,With history of metastatic non-small cell lung cancer, squamosal carcinoma diagnosed as stage IIIB in March 2011 in [place] and underwent induction chemotherapy with carboplatin and paclitaxel followed by chemoradiation in July 2013 with stable disease. He also underwent decompressive T11-L1 laminectomies, right T11-12 never division and T11-L1 posterior spinal fusion in June 2015. The pathology showed poorly differentiated adenocarcinoma with focal squamous features. He also underwent CyberKnife radiosurgery to the vertebral body tumor in August 2015. He is following up with Dr. Julien Smith and being observed for a stable disease. Over the past few weeks patient was having increased low back pain. Patient reports having CT of his abdomen and pelvis 2 months back which was concerning for a tumor in the spine. He was referred to a spinal surgeon in [facility] who ordered an MRI of his thoracic and lumbar spine which showed metastatic disease of L1. No cord compression was noted. Over the past 2 weeks his low back pain has been worsened radiating to  right back and groin. Denies radiation of pain to his thighs or legs. His home medications are not effectively controlling his pain. Denies any urinary retention or bowel incontinence (patient does inform that he has some urinary hesitancy for the past few weeks). Patient went to see his spinal surgeon for his MRI result last week and was started on 5-7 days of prednisone taper which she completed 2 days back. He was then referred to Dr. Julien Smith .Patient called Dr. Earlie Smith and was asked to come to EDfor observation. Vitals in the ED including labs were stable. Patient was admitted for pain control, was seen by radiation oncology, palliative care for pain control.   Hospital Course  Metastatic non-small cell lung cancer/squamous cell carcinoma with history of metastatic bone disease -Supposedly patient has new L1 metastatic disease. He was admitted for observation. -Admitting physician, discussed patient with Dr. Kathyrn Smith and reviewed MRI findings that were done at outside facility. Since there was no cord involvement noted, did not require neurosurgical intervention at this time. -Oncology consulted and appreciated, Dr. Earlie Smith consulted radiation oncology. Continue Decadron and pain control at this time, Rad Onc plan to possibly start radiation today and then follow up with patient in clinic and plan treatment going forward -did obtain records from [facility] and recent MRI, Rad Onc reviewed these -He was seen by palliative care for pain management, he is now on a good regimen with Oxycontin and oxycodone PRN. Discussed again today with palliative care and they are agreeable to discharge today. Rad Onc has agreed to manage his pain while he is under their care.  Tobacco  abuse -Smoking cessation discussed.  DVT Prophylaxis  lovenox  Code Status: Full  Family Communication: None at bedside  Consultants Oncology Radiation oncology Neurosurgery, via phone (Herbert Smith) Palliative care  Procedures  None  Antibiotics      Anti-infectives    None     Discharge Exam: BP 129/71 (BP Location: Left Arm)   Pulse (!) 47   Temp 98.1 F (36.7 C) (Oral)   Resp 14   Ht '6\' 8"'$  (2.032 m)   Wt 83 kg (182 lb 15.7 oz)   SpO2 97%   BMI 20.10 kg/m  General:  Alert, oriented, calm, in no acute distress  Eyes: pupils round and reactive to light and accomodation, clear sclerea Neck: supple, no masses, trachea mildline  Cardiovascular: RRR, no murmurs or rubs, no peripheral edema  Respiratory: clear to auscultation bilaterally, no wheezes, no crackles  Abdomen: soft, nontender, nondistended, normal bowel tones heard  Skin: dry, no rashes  Musculoskeletal: no joint effusions, normal range of motion  Psychiatric: appropriate affect, normal speech  Neurologic: extraocular muscles intact, clear speech, moving all extremities with intact sensorium    Discharge Instructions You were cared for by a hospitalist during your hospital stay. If you have any questions about your discharge medications or the care you received while you were in the hospital after you are discharged, you can call the unit and asked to speak with the hospitalist on call if the hospitalist that took care of you is not available. Once you are discharged, your primary care physician will handle any further medical issues. Please note that NO REFILLS for any discharge medications will be authorized once you are discharged, as it is imperative that you return to your primary care physician (or establish a relationship with a primary care physician if you do not have one) for your aftercare needs so that they can reassess your need for medications and monitor your lab values.  Discharge Instructions    Diet - low sodium heart healthy    Complete by:  As directed    Increase activity slowly    Complete by:  As directed        Medication List    STOP taking these medications   traMADol  50 MG tablet Commonly known as:  ULTRAM     TAKE these medications   acetaminophen 325 MG tablet Commonly known as:  TYLENOL Take 2 tablets (650 mg total) by mouth every 6 (six) hours as needed for mild pain (or Fever >/= 101).   albuterol 108 (90 Base) MCG/ACT inhaler Commonly known as:  PROVENTIL HFA;VENTOLIN HFA Inhale 2 puffs into the lungs every 6 (six) hours as needed for wheezing or shortness of breath.   albuterol (2.5 MG/3ML) 0.083% nebulizer solution Commonly known as:  PROVENTIL Take 2.5 mg by nebulization every 6 (six) hours as needed for wheezing or shortness of breath.   bisacodyl 5 MG EC tablet Commonly known as:  DULCOLAX Take 1 tablet (5 mg total) by mouth daily as needed for moderate constipation.   cyclobenzaprine 5 MG tablet Commonly known as:  FLEXERIL Take 1 tablet (5 mg total) by mouth 3 (three) times daily as needed for muscle spasms.   dexamethasone 4 MG tablet Commonly known as:  DECADRON Take 1 tablet (4 mg total) by mouth every 6 (six) hours.   diphenhydrAMINE 25 MG tablet Commonly known as:  BENADRYL Take 50 mg by mouth at bedtime as needed for sleep.   feeding supplement (  ENSURE ENLIVE) Liqd Take 237 mLs by mouth 3 (three) times daily between meals.   gabapentin 300 MG capsule Commonly known as:  NEURONTIN Take 300 mg by mouth 3 (three) times daily.   Oxycodone HCl 10 MG Tabs Take 1 tablet (10 mg total) by mouth every 3 (three) hours as needed for severe pain or breakthrough pain. What changed:  medication strength  how much to take  when to take this  reasons to take this   oxyCODONE 30 MG 12 hr tablet Take 30 mg by mouth every 12 (twelve) hours. What changed:  You were already taking a medication with the same name, and this prescription was added. Make sure you understand how and when to take each.   pantoprazole 40 MG tablet Commonly known as:  PROTONIX Take 1 tablet (40 mg total) by mouth daily.   senna-docusate 8.6-50 MG  tablet Commonly known as:  Senokot-S Take 2 tablets by mouth 2 (two) times daily.   tiotropium 18 MCG inhalation capsule Commonly known as:  SPIRIVA Place 18 mcg into inhaler and inhale 2 (two) times daily.      Allergies  Allergen Reactions  . Fentanyl Itching      The results of significant diagnostics from this hospitalization (including imaging, microbiology, ancillary and laboratory) are listed below for reference.    Significant Diagnostic Studies: No results found.  Microbiology: No results found for this or any previous visit (from the past 240 hour(s)).   Labs: Basic Metabolic Panel:  Recent Labs Lab 06/02/16 1502 06/02/16 1512 06/05/16 0427  NA 138 140 136  K 4.3 4.2 4.2  CL 100* 97* 99*  CO2 31  --  30  GLUCOSE 82 80 112*  BUN 22* 26* 27*  CREATININE 0.88 0.80 0.96  CALCIUM 10.2  --  10.1   Liver Function Tests:  Recent Labs Lab 06/02/16 1502  AST 24  ALT 28  ALKPHOS 61  BILITOT 0.5  PROT 8.0  ALBUMIN 4.3   No results for input(s): LIPASE, AMYLASE in the last 168 hours. No results for input(s): AMMONIA in the last 168 hours. CBC:  Recent Labs Lab 06/02/16 1502 06/02/16 1512 06/05/16 0427  WBC 6.4  --  12.4*  NEUTROABS 3.0  --   --   HGB 13.9 15.6 12.9*  HCT 43.1 46.0 39.1  MCV 87.6  --  86.1  PLT 258  --  221   Cardiac Enzymes: No results for input(s): CKTOTAL, CKMB, CKMBINDEX, TROPONINI in the last 168 hours. BNP: BNP (last 3 results) No results for input(s): BNP in the last 8760 hours.  ProBNP (last 3 results) No results for input(s): PROBNP in the last 8760 hours.  CBG:  Recent Labs Lab 06/03/16 1154 06/03/16 1620 06/03/16 2121 06/04/16 0755 06/04/16 1135  GLUCAP 127* 156* 96 119* 110*    Time spent: 42 minutes were spent in preparing this discharge including medication reconciliation, counseling, and coordination of care.  Signed:  Keondre Markson [employer]  [facility] 06/05/2016, 9:28 AM

## 2016-06-05 NOTE — Progress Notes (Signed)
                                                                                                                                                                                                         Daily Progress Note   Patient Name: Herbert Smith       Date: 06/05/2016 DOB: 10/28/1955  Age: 60 y.o. MRN#: [id number] Attending Physician: No att. providers found Primary Care Physician: OSEI-BONSU,GEORGE, MD Admit Date: 06/02/2016  Reason for Consultation/Follow-up: Pain control  Subjective: Met with Mr Arismendez this AM.  He reports that his pain is much better controlled on current regimen.  He is much more functional and has been ambulating without difficulty.  Last BM yesterday.  States only concern is ensuring he has f/u on discharge.   Length of Stay: 2  Current Medications: Scheduled Meds:  . dexamethasone  4 mg Oral Q6H  . enoxaparin (LOVENOX) injection  40 mg Subcutaneous Q24H  . feeding supplement (ENSURE ENLIVE)  237 mL Oral TID BM  . gabapentin  300 mg Oral TID  . insulin aspart  0-9 Units Subcutaneous TID WC  . oxyCODONE  30 mg Oral Q12H  . pantoprazole  40 mg Oral Daily  . senna-docusate  2 tablet Oral BID  . [START ON 06/06/2016] tiotropium  18 mcg Inhalation Daily    Continuous Infusions:    PRN Meds: acetaminophen **OR** acetaminophen, albuterol, albuterol, bisacodyl, bisacodyl, cyclobenzaprine, diphenhydrAMINE, HYDROmorphone (DILAUDID) injection, ketorolac, ketorolac, ondansetron **OR** ondansetron (ZOFRAN) IV, oxyCODONE  Physical Exam   General: Alert, awake, in no acute distress.  HEENT: No bruits, no goiter, no JVD Heart: Regular rate and rhythm. No murmur appreciated. Lungs: Good air movement, clear Abdomen: Soft, nontender, nondistended, positive bowel sounds.  Ext: No significant edema Skin: Warm and dry Neuro: Grossly intact, nonfocal.         Vital Signs: BP 129/71 (BP Location: Left Arm)   Pulse (!) 47   Temp 98.1 F (36.7 C) (Oral)   Resp  14   Ht 6' 8" (2.032 m)   Wt 83 kg (182 lb 15.7 oz)   SpO2 97%   BMI 20.10 kg/m  SpO2: SpO2: 97 % O2 Device: O2 Device: Not Delivered O2 Flow Rate:    Intake/output summary:  Intake/Output Summary (Last 24 hours) at 06/05/16 1144 Last data filed at 06/05/16 0845  Gross per 24 hour  Intake             1480 ml  Output                  0 ml  Net             1480 ml   LBM: Last BM Date: 06/03/16 Baseline Weight: Weight: 83 kg (183 lb) Most recent weight: Weight: 83 kg (182 lb 15.7 oz)       Palliative Assessment/Data:    Flowsheet Rows   Flowsheet Row Most Recent Value  Intake Tab  Referral Department  Hospitalist  Date Notified  06/04/16  Palliative Care Type  New Palliative care  Reason for referral  Pain  Clinical Assessment  Palliative Performance Scale Score  60%  Psychosocial & Spiritual Assessment  Palliative Care Outcomes  Patient/Family meeting held?  No  Palliative Care Outcomes  Improved pain interventions      Patient Active Problem List   Diagnosis Date Noted  . Tobacco abuse   . Metastatic cancer to spine ([place]) 06/02/2016  . Low back pain at multiple sites 06/02/2016  . Low back pain 06/02/2016  . Back pain 09/26/2014  . Primary cancer of right upper lobe of lung ([place]) 09/12/2014    Palliative Care Assessment & Plan   Recommendations/Plan:  Pain, neoplasm related: Much better controlled on current regimen.  Recommend continue same including Oxycontin 30BID, rescue oxycodone, and steroids on discharge.  Constipation: Opioid induced.  Continue senna BID.  Discussed addition of miralax as OP if he does not continue to have regular BM following discharge on higher dose narcotics.  Code Status:    Code Status Orders        Start     Ordered   06/02/16 1818  Full code  Continuous     06/02/16 1817    Code Status History    Date Active Date Inactive Code Status Order ID Comments User Context   06/02/2016  6:17 PM 06/03/2016  5:50 PM Full Code  [id number]  Louellen Molder, MD Inpatient       Prognosis:   Unable to determine  Discharge Planning:  Home  Care plan was discussed with Patient, Dr. Hollice Gong.  Thank you for allowing the Palliative Medicine Team to assist in the care of this patient.   Time In: 0955 Time Out: 1015 Total Time 20 Prolonged Time Billed No      Greater than 50%  of this time was spent counseling and coordinating care related to the above assessment and plan.  Micheline Rough, MD  Please contact Palliative Medicine Team phone at [phone] for questions and concerns.

## 2016-06-05 NOTE — Progress Notes (Signed)
Patient d/c home,stable. 

## 2016-06-09 ENCOUNTER — Telehealth: Payer: Self-pay | Admitting: Internal Medicine

## 2016-06-09 NOTE — Telephone Encounter (Signed)
Spoke with pt to confirm 10/3 appt date/time per LOS. Central radiology to call pt and schedule CT scan

## 2016-06-10 ENCOUNTER — Institutional Professional Consult (permissible substitution): Payer: Self-pay | Admitting: Radiation Oncology

## 2016-06-11 ENCOUNTER — Telehealth: Payer: Self-pay | Admitting: Medical Oncology

## 2016-06-11 ENCOUNTER — Other Ambulatory Visit: Payer: Self-pay | Admitting: Medical Oncology

## 2016-06-11 DIAGNOSIS — C3411 Malignant neoplasm of upper lobe, right bronchus or lung: Secondary | ICD-10-CM

## 2016-06-11 DIAGNOSIS — C7951 Secondary malignant neoplasm of bone: Secondary | ICD-10-CM

## 2016-06-11 NOTE — Telephone Encounter (Signed)
Pt called and stated he feels like his insides are hard and " when I eat it cripples me over I hurt so bad in my stomach and back. I can't stand up to put my pants on because my legs are so weak".  States he is having BM and has voided twice today. I instructed pt to go to ED if his symptoms worsen . CT scans scheduled for tomorrow.

## 2016-06-12 ENCOUNTER — Ambulatory Visit (HOSPITAL_COMMUNITY)
Admission: RE | Admit: 2016-06-12 | Discharge: 2016-06-12 | Disposition: A | Discharge status: Home / Self Care | Payer: Medicaid Other | Source: Ambulatory Visit | Attending: Internal Medicine | Admitting: Internal Medicine

## 2016-06-12 DIAGNOSIS — C7951 Secondary malignant neoplasm of bone: Secondary | ICD-10-CM

## 2016-06-12 DIAGNOSIS — N200 Calculus of kidney: Secondary | ICD-10-CM | POA: Insufficient documentation

## 2016-06-12 DIAGNOSIS — C3411 Malignant neoplasm of upper lobe, right bronchus or lung: Secondary | ICD-10-CM

## 2016-06-12 MED ORDER — IOPAMIDOL (ISOVUE-300) INJECTION 61%
30.0000 mL | Freq: Once | INTRAVENOUS | Status: AC | PRN
Start: 2016-06-12 — End: 2016-06-12
  Administered 2016-06-12: 30 mL via ORAL

## 2016-06-12 MED ORDER — IOPAMIDOL (ISOVUE-300) INJECTION 61%
100.0000 mL | Freq: Once | INTRAVENOUS | Status: AC | PRN
  Administered 2016-06-12: 100 mL via INTRAVENOUS

## 2016-06-17 ENCOUNTER — Other Ambulatory Visit: Payer: Medicaid Other

## 2016-06-17 ENCOUNTER — Ambulatory Visit: Payer: Medicaid Other | Admitting: Internal Medicine

## 2016-06-17 ENCOUNTER — Telehealth: Payer: Self-pay | Admitting: Radiation Oncology

## 2016-06-17 DIAGNOSIS — C3411 Malignant neoplasm of upper lobe, right bronchus or lung: Secondary | ICD-10-CM

## 2016-06-17 NOTE — Telephone Encounter (Signed)
LM for pt to call back to review recommendations from conference.

## 2016-06-23 ENCOUNTER — Encounter: Payer: Self-pay | Admitting: Internal Medicine

## 2016-06-23 ENCOUNTER — Ambulatory Visit (HOSPITAL_COMMUNITY): Payer: Medicaid Other

## 2016-06-23 ENCOUNTER — Ambulatory Visit (HOSPITAL_BASED_OUTPATIENT_CLINIC_OR_DEPARTMENT_OTHER): Payer: Medicaid Other | Admitting: Internal Medicine

## 2016-06-23 ENCOUNTER — Telehealth: Payer: Self-pay | Admitting: Medical Oncology

## 2016-06-23 ENCOUNTER — Telehealth: Payer: Self-pay | Admitting: Internal Medicine

## 2016-06-23 VITALS — BP 126/65 | HR 64 | Temp 98.6°F | Resp 16 | Ht >= 80 in | Wt 187.3 lb

## 2016-06-23 DIAGNOSIS — C3411 Malignant neoplasm of upper lobe, right bronchus or lung: Secondary | ICD-10-CM | POA: Diagnosis not present

## 2016-06-23 DIAGNOSIS — G893 Neoplasm related pain (acute) (chronic): Secondary | ICD-10-CM

## 2016-06-23 DIAGNOSIS — M545 Low back pain: Secondary | ICD-10-CM

## 2016-06-23 DIAGNOSIS — C7951 Secondary malignant neoplasm of bone: Secondary | ICD-10-CM

## 2016-06-23 NOTE — Progress Notes (Signed)
[place] [phone]   [phone]  OFFICE PROGRESS NOTE  Benito Mccreedy, MD [address] [place] [address]  DIAGNOSIS: Metastatic non-small cell lung cancer, squamous cell carcinoma initially diagnosed as a stage IIIB in March 2011 in [place], [place] THERAPY: 1) status post induction chemotherapy with carboplatin and paclitaxel followed by a course of concurrent chemoradiation with carboplatin and paclitaxel completed in July 2013 with stable disease in the chest. This was under the care of Dr. Bradly Chris in [place]. 2) status post decompressive T11-L1 laminectomies, right T11-12 nerve root division and T11-L1 posterior spinal fusion on 03/18/2014. 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 The tumor was negative for EGFR mutation and negative for ALK gene translocation. 3) status post CyberKnife radiosurgery to the vertebral body tumor in early August 2015.  CURRENT THERAPY: Observation.  INTERVAL HISTORY: Herbert Smith 60 y.o. male returns to the clinic today for follow-up visit. The patient is feeling fine today with no specific complaints except for the persistent low back pain. He is currently on MS Contin 30 mg every 12 hour and oxycodone 10 mg by mouth every 4-6 hours daily as needed for pain. He is also on Neurontin 600 mg by mouth 3 times a day in addition to when necessary Aleve. He was seen at the spine center recently and MRI of the thoracolumbar spine showed a questionable epidural extension at the T11 consistent with his previous disease. The patient was admitted to [facility] for further evaluation and he was started on Decadron 4 mg every 6 hours. He was also seen by radiation oncology but has not received any additional treatment. He denied having any other significant complaints today. He denied having any  significant chest pain, shortness of breath, cough or hemoptysis. He denied having any significant weight loss or night sweats. The patient has no nausea or vomiting, no fever or chills. His pain was getting worse and he had repeat CT scan of the chest, abdomen and pelvis performed recently and he is here for evaluation and discussion of his scan results.  MEDICAL HISTORY: Past Medical History:  Diagnosis Date  . Cancer ([place])    lung ca dx'd 2011  . Primary cancer of right upper lobe of lung ([place]) 09/12/2014    ALLERGIES:  is allergic to fentanyl.  MEDICATIONS:  Current Outpatient Prescriptions  Medication Sig Dispense Refill  . acetaminophen (TYLENOL) 325 MG tablet Take 2 tablets (650 mg total) by mouth every 6 (six) hours as needed for mild pain (or Fever >/= 101). 60 tablet 0  . albuterol (PROVENTIL HFA;VENTOLIN HFA) 108 (90 BASE) MCG/ACT inhaler Inhale 2 puffs into the lungs every 6 (six) hours as needed for wheezing or shortness of breath.    Marland Kitchen albuterol (PROVENTIL) (2.5 MG/3ML) 0.083% nebulizer solution Take 2.5 mg by nebulization every 6 (six) hours as needed for wheezing or shortness of breath.    . bisacodyl (DULCOLAX) 5 MG EC tablet Take 1 tablet (5 mg total) by mouth daily as needed for moderate constipation. 30 tablet 1  . cyclobenzaprine (FLEXERIL) 5 MG tablet Take 1 tablet (5 mg total) by mouth 3 (three) times daily as needed for muscle spasms. 30 tablet 0  . dexamethasone (DECADRON) 4 MG tablet Take 1 tablet (4 mg total) by mouth every 6 (six) hours. 90 tablet 0  . diphenhydrAMINE (BENADRYL) 25 MG tablet Take 50 mg  by mouth at bedtime as needed for sleep.    . feeding supplement, ENSURE ENLIVE, (ENSURE ENLIVE) LIQD Take 237 mLs by mouth 3 (three) times daily between meals. 237 mL 12  . gabapentin (NEURONTIN) 300 MG capsule Take 300 mg by mouth 3 (three) times daily.    Marland Kitchen oxyCODONE 10 MG TABS Take 1 tablet (10 mg total) by mouth every 3 (three) hours as needed for severe pain or  breakthrough pain. 150 tablet 0  . oxyCODONE 30 MG 12 hr tablet Take 30 mg by mouth every 12 (twelve) hours. 60 tablet 0  . pantoprazole (PROTONIX) 40 MG tablet Take 1 tablet (40 mg total) by mouth daily. 30 tablet 0  . senna-docusate (SENOKOT-S) 8.6-50 MG tablet Take 2 tablets by mouth 2 (two) times daily. 60 tablet 0  . tiotropium (SPIRIVA) 18 MCG inhalation capsule Place 18 mcg into inhaler and inhale 2 (two) times daily.     No current facility-administered medications for this visit.     SURGICAL HISTORY:  Past Surgical History:  Procedure Laterality Date  . LAMINECTOMY  03/18/2014   T11-L1 laminectomy with right T11-12 nerve root division, and T11-L1 posterior spinal fusion    REVIEW OF SYSTEMS:  A comprehensive review of systems was negative except for: Musculoskeletal: positive for back pain   PHYSICAL EXAMINATION: General appearance: alert, cooperative, fatigued and no distress Head: Normocephalic, without obvious abnormality, atraumatic Neck: no adenopathy, no JVD, supple, symmetrical, trachea midline and thyroid not enlarged, symmetric, no tenderness/mass/nodules Lymph nodes: Cervical, supraclavicular, and axillary nodes normal. Resp: clear to auscultation bilaterally Back: symmetric, no curvature. ROM normal. No CVA tenderness. Cardio: regular rate and rhythm, S1, S2 normal, no murmur, click, rub or gallop GI: soft, non-tender; bowel sounds normal; no masses,  no organomegaly Extremities: extremities normal, atraumatic, no cyanosis or edema Neurologic: Alert and oriented X 3, normal strength and tone. Normal symmetric reflexes. Normal coordination and gait  ECOG PERFORMANCE STATUS: 1 - Symptomatic but completely ambulatory  Blood pressure 126/65, pulse 64, temperature 98.6 F (37 C), temperature source Oral, resp. rate 16, height _0  (2.032 m), weight 187 lb 4.8 oz (85 kg), SpO2 100 %.  LABORATORY DATA: Lab Results  Component Value Date   WBC 12.4 (H) 06/05/2016    HGB 12.9 (L) 06/05/2016   HCT 39.1 06/05/2016   MCV 86.1 06/05/2016   PLT 221 06/05/2016      Chemistry      Component Value Date/Time   NA 136 06/05/2016 0427   NA 139 03/18/2016 1019   K 4.2 06/05/2016 0427   K 4.5 03/18/2016 1019   CL 99 (L) 06/05/2016 0427   CO2 30 06/05/2016 0427   CO2 29 03/18/2016 1019   BUN 27 (H) 06/05/2016 0427   BUN 17.0 03/18/2016 1019   CREATININE 0.96 06/05/2016 0427   CREATININE 0.9 03/18/2016 1019      Component Value Date/Time   CALCIUM 10.1 06/05/2016 0427   CALCIUM 10.2 03/18/2016 1019   ALKPHOS 61 06/02/2016 1502   ALKPHOS 73 03/18/2016 1019   AST 24 06/02/2016 1502   AST 25 03/18/2016 1019   ALT 28 06/02/2016 1502   ALT 16 03/18/2016 1019   BILITOT 0.5 06/02/2016 1502   BILITOT 0.38 03/18/2016 1019       RADIOGRAPHIC STUDIES: Ct Chest W Contrast  Result Date: 06/12/2016 CLINICAL DATA:  Right upper lobe primary lung cancer with metastasis to spine. Abdominal pain after eating. EXAM: CT CHEST, ABDOMEN, AND PELVIS WITH CONTRAST  TECHNIQUE: Multidetector CT imaging of the chest, abdomen and pelvis was performed following the standard protocol during bolus administration of intravenous contrast. CONTRAST:  125m ISOVUE-300 IOPAMIDOL (ISOVUE-300) INJECTION 61% COMPARISON:  Thoracolumbar spine MR of 05/21/2016. Prior diagnostic CTs of 03/18/2016. FINDINGS: CT CHEST FINDINGS Cardiovascular: Tortuous descending thoracic aorta. Normal heart size, without pericardial effusion. No central pulmonary embolism, on this non-dedicated study. Mediastinum/Nodes: No supraclavicular adenopathy. No mediastinal or definite hilar adenopathy, given limitations of unenhanced CT. Retrocrural soft tissue fullness is again identified. On the order of 2.7 x 2.7 cm on image 66/series 2. Similar Lungs/Pleura: Mild right-sided pleural thickening with minimal loculated pleural fluid inferiorly. Similar. Bullous type emphysema involving the apices. Right lower lobe volume  loss which is secondary to mass effect from the dominant right-sided bleb. Well-circumscribed fluid density lesion which is favored to be within the posterior right upper lobe measures 6.2 x 5.2 cm on image 63/ series 5. Similar compared to 5.9 by 5.2 cm on the prior. Adjacent volume loss. Minimal subpleural nodularity anterior left lower lobe on image 178/ series 5 is unchanged. Musculoskeletal: T11-L1 trans pedicle screw fixation again identified. T12 sclerotic metastasis is similar. S-shaped thoracolumbar spine curvature CT ABDOMEN PELVIS FINDINGS Hepatobiliary: Normal liver. Normal gallbladder, without biliary ductal dilatation. Pancreas: Normal, without mass or ductal dilatation. Spleen: Normal in size, without focal abnormality. Adrenals/Urinary Tract: Normal adrenal glands. Bilateral renal collecting system calculi. Interpolar right renal 3.6 cm cyst. Normal urinary bladder. Stomach/Bowel: Normal stomach, without wall thickening. Colonic stool burden suggests constipation. Normal terminal ileum and appendix. Normal small bowel. Vascular/Lymphatic: Aortic and branch vessel atherosclerosis. No abdominopelvic adenopathy. Reproductive: Mild prostatomegaly. Other: No significant free fluid. Musculoskeletal: Tarlov cysts. L1 metastasis described on MRI is only subtly apparent. Maintenance of lumbar vertebral body height. IMPRESSION: CT CHEST IMPRESSION 1. No significant change since the exam of 03/18/2016. 2. Bullous type emphysema with re- demonstration of a right lung well-circumscribed cystic lesion which is indeterminate. 3. Persistent retrocrural soft tissue fullness anterior to the dominant T12 metastasis. These could represent direct tumor extension and/or adjacent adenopathy. CT ABDOMEN AND PELVIS IMPRESSION 1. No evidence of soft tissue metastatic disease in the abdomen or pelvis. No explanation for patient's symptoms. 2.  Possible constipation. 3. Bilateral nephrolithiasis. Electronically Signed   By:  KAbigail MiyamotoM.D.   On: 06/12/2016 15:52   Ct Abdomen Pelvis W Contrast  Result Date: 06/12/2016 CLINICAL DATA:  Right upper lobe primary lung cancer with metastasis to spine. Abdominal pain after eating. EXAM: CT CHEST, ABDOMEN, AND PELVIS WITH CONTRAST TECHNIQUE: Multidetector CT imaging of the chest, abdomen and pelvis was performed following the standard protocol during bolus administration of intravenous contrast. CONTRAST:  1058mISOVUE-300 IOPAMIDOL (ISOVUE-300) INJECTION 61% COMPARISON:  Thoracolumbar spine MR of 05/21/2016. Prior diagnostic CTs of 03/18/2016. FINDINGS: CT CHEST FINDINGS Cardiovascular: Tortuous descending thoracic aorta. Normal heart size, without pericardial effusion. No central pulmonary embolism, on this non-dedicated study. Mediastinum/Nodes: No supraclavicular adenopathy. No mediastinal or definite hilar adenopathy, given limitations of unenhanced CT. Retrocrural soft tissue fullness is again identified. On the order of 2.7 x 2.7 cm on image 66/series 2. Similar Lungs/Pleura: Mild right-sided pleural thickening with minimal loculated pleural fluid inferiorly. Similar. Bullous type emphysema involving the apices. Right lower lobe volume loss which is secondary to mass effect from the dominant right-sided bleb. Well-circumscribed fluid density lesion which is favored to be within the posterior right upper lobe measures 6.2 x 5.2 cm on image 63/ series 5. Similar compared to 5.9 by  5.2 cm on the prior. Adjacent volume loss. Minimal subpleural nodularity anterior left lower lobe on image 178/ series 5 is unchanged. Musculoskeletal: T11-L1 trans pedicle screw fixation again identified. T12 sclerotic metastasis is similar. S-shaped thoracolumbar spine curvature CT ABDOMEN PELVIS FINDINGS Hepatobiliary: Normal liver. Normal gallbladder, without biliary ductal dilatation. Pancreas: Normal, without mass or ductal dilatation. Spleen: Normal in size, without focal abnormality.  Adrenals/Urinary Tract: Normal adrenal glands. Bilateral renal collecting system calculi. Interpolar right renal 3.6 cm cyst. Normal urinary bladder. Stomach/Bowel: Normal stomach, without wall thickening. Colonic stool burden suggests constipation. Normal terminal ileum and appendix. Normal small bowel. Vascular/Lymphatic: Aortic and branch vessel atherosclerosis. No abdominopelvic adenopathy. Reproductive: Mild prostatomegaly. Other: No significant free fluid. Musculoskeletal: Tarlov cysts. L1 metastasis described on MRI is only subtly apparent. Maintenance of lumbar vertebral body height. IMPRESSION: CT CHEST IMPRESSION 1. No significant change since the exam of 03/18/2016. 2. Bullous type emphysema with re- demonstration of a right lung well-circumscribed cystic lesion which is indeterminate. 3. Persistent retrocrural soft tissue fullness anterior to the dominant T12 metastasis. These could represent direct tumor extension and/or adjacent adenopathy. CT ABDOMEN AND PELVIS IMPRESSION 1. No evidence of soft tissue metastatic disease in the abdomen or pelvis. No explanation for patient's symptoms. 2.  Possible constipation. 3. Bilateral nephrolithiasis. Electronically Signed   By: Abigail Miyamoto M.D.   On: 06/12/2016 15:52    ASSESSMENT AND PLAN: This is a very pleasant 60 years old African-American male with metastatic non-small cell lung cancer initially diagnosed as a stage IIIB squamous cell carcinoma but the patient has metastatic disease at the T12 status post decompressive laminectomy and the final pathology was consistent with adenocarcinoma with negative EGFR mutation and negative ALK gene translocation. The recent CT scan of the chest, abdomen and pelvis showed stable disease. I discussed the scan results with the patient today. I recommended for him to continue on observation with repeat CT scan of the chest, abdomen and pelvis in 6 months. For the persistent back pain, he will continue on his  current pain medication and he has follow-up appointment with radiation oncology for discussion of any further radiotherapy. Unfortunately his prognosis is poor and this patient is at risk for cord compression if his disease continues to progress at that area. Chemotherapy or immunotherapy is not an option in his condition and may actually worsen his disease initially. He was advised to call immediately if he has any concerning symptoms in the interval. The patient voices understanding of current disease status and treatment options and is in agreement with the current care plan.  All questions were answered. The patient knows to call the clinic with any problems, questions or concerns. We can certainly see the patient much sooner if necessary.   Disclaimer: This note was dictated with voice recognition software. Similar sounding words can inadvertently be transcribed and may not be corrected upon review.

## 2016-06-23 NOTE — Telephone Encounter (Signed)
Gave patient avs report and appointments for April. Central radiology will call re scan.  °

## 2016-06-23 NOTE — Telephone Encounter (Signed)
No answer

## 2016-06-25 ENCOUNTER — Telehealth: Payer: Self-pay | Admitting: *Deleted

## 2016-06-25 NOTE — Telephone Encounter (Signed)
Called patient to inform of Pet Scan on 07-03-16- arrival time - 7:30 am , pt. To be in NPO after midnight, lvm for a return call

## 2016-06-25 NOTE — Telephone Encounter (Signed)
Called patient to inform of Pet Scan on 07-03-16- arrival time- 7:30 am , pt. To be NPO after midnight, spoke with patient and he is aware of this test

## 2016-06-26 ENCOUNTER — Telehealth: Payer: Self-pay | Admitting: *Deleted

## 2016-06-26 NOTE — Telephone Encounter (Signed)
Called patient home , left vm to [facility] [place] by 5pm today or Monday, asking about his pain and what's goingon with his leg,and if he saw Dr. Maryjean Ka 4:46 PM

## 2016-06-30 ENCOUNTER — Telehealth: Payer: Self-pay | Admitting: Medical Oncology

## 2016-06-30 ENCOUNTER — Other Ambulatory Visit: Payer: Self-pay | Admitting: Medical Oncology

## 2016-06-30 NOTE — Telephone Encounter (Signed)
Returned call after pt left message for refill on pain med.[name] I left message and told pt that he will not be refilling pain meds -he has to go to pain clinic with Dr Maryjean Ka per radiation referral.

## 2016-07-01 ENCOUNTER — Telehealth: Payer: Self-pay | Admitting: *Deleted

## 2016-07-01 NOTE — Telephone Encounter (Signed)
Received call from pt. In regards to getting a refill on pain meds. He stated that he didn't want to leave a message, because he had already. And he just wanted pain meds refilled.

## 2016-07-01 NOTE — Telephone Encounter (Signed)
Unfortunately we have not treated him or provided care to him. I've only seen this patient once in consultation, so my role is not to prescribe narcotics for this patient. I'm not sure if he's able to take NSAIDs, but if he is medically able, I would recommend OTC dosing of ibuprofen or aleve.

## 2016-07-01 NOTE — Telephone Encounter (Signed)
appt with Dr Lovenia Shuck on Nov 14th .

## 2016-07-01 NOTE — Telephone Encounter (Addendum)
I told his fiance Gregary Signs that Radiation will not prescribe pain med wither and to tell pt to take OTC ibuprophen or aleve or go to ED for increased pian.

## 2016-07-03 ENCOUNTER — Telehealth: Payer: Self-pay | Admitting: Medical Oncology

## 2016-07-03 ENCOUNTER — Emergency Department (HOSPITAL_COMMUNITY)
Admission: EM | Admit: 2016-07-03 | Discharge: 2016-07-03 | Disposition: A | Discharge status: Home / Self Care | Payer: Medicaid Other | Attending: Emergency Medicine | Admitting: Emergency Medicine

## 2016-07-03 ENCOUNTER — Encounter: Payer: Self-pay | Admitting: Radiation Oncology

## 2016-07-03 ENCOUNTER — Encounter (HOSPITAL_COMMUNITY)
Admission: RE | Admit: 2016-07-03 | Discharge: 2016-07-03 | Disposition: A | Discharge status: Home / Self Care | Payer: Medicaid Other | Source: Ambulatory Visit | Attending: Radiation Oncology | Admitting: Radiation Oncology

## 2016-07-03 ENCOUNTER — Encounter (HOSPITAL_COMMUNITY): Payer: Self-pay | Admitting: Emergency Medicine

## 2016-07-03 DIAGNOSIS — Z79899 Other long term (current) drug therapy: Secondary | ICD-10-CM | POA: Insufficient documentation

## 2016-07-03 DIAGNOSIS — C3411 Malignant neoplasm of upper lobe, right bronchus or lung: Secondary | ICD-10-CM | POA: Insufficient documentation

## 2016-07-03 DIAGNOSIS — F17210 Nicotine dependence, cigarettes, uncomplicated: Secondary | ICD-10-CM | POA: Insufficient documentation

## 2016-07-03 DIAGNOSIS — M546 Pain in thoracic spine: Secondary | ICD-10-CM | POA: Diagnosis not present

## 2016-07-03 DIAGNOSIS — C7951 Secondary malignant neoplasm of bone: Secondary | ICD-10-CM | POA: Diagnosis not present

## 2016-07-03 DIAGNOSIS — R52 Pain, unspecified: Secondary | ICD-10-CM

## 2016-07-03 LAB — GLUCOSE, CAPILLARY: Glucose-Capillary: 83 mg/dL (ref 65–99)

## 2016-07-03 MED ORDER — OXYCODONE HCL 10 MG PO TABS: 10.0000 mg | ORAL_TABLET | Freq: Three times a day (TID) | ORAL | 0 refills | Status: DC | PRN

## 2016-07-03 MED ORDER — FLUDEOXYGLUCOSE F - 18 (FDG) INJECTION: 9.4000 | Freq: Once | INTRAVENOUS | Status: DC | PRN

## 2016-07-03 MED ORDER — OXYCODONE HCL ER 30 MG PO T12A
30.0000 mg | EXTENDED_RELEASE_TABLET | Freq: Two times a day (BID) | ORAL | 0 refills | Status: DC
Start: 2016-07-03 — End: 2016-07-16

## 2016-07-03 MED ORDER — OXYCODONE HCL 5 MG PO TABS
10.0000 mg | ORAL_TABLET | Freq: Once | ORAL | Status: AC
  Administered 2016-07-03: 10 mg via ORAL
  Filled 2016-07-03: qty 2

## 2016-07-03 MED ORDER — OXYCODONE HCL ER 15 MG PO T12A
30.0000 mg | EXTENDED_RELEASE_TABLET | Freq: Two times a day (BID) | ORAL | Status: DC
  Administered 2016-07-03: 30 mg via ORAL
  Filled 2016-07-03: qty 2

## 2016-07-03 NOTE — ED Triage Notes (Signed)
Per pt, states he ran out of pain meds-history of spinal cancer-unable to see MD for another month

## 2016-07-03 NOTE — Progress Notes (Addendum)
ED CM Spoke ED PA/NA, Abigail who confirms this is not the pt needing CM services Cm confirmed with pt he does not need DME and uses a cane that he has with him  Pt with male visitor with him who is insisting they have to leave [facility] and has been d/c at this time  No cm interventions needed at this time

## 2016-07-03 NOTE — Progress Notes (Signed)
07/03/16: To the ED staff- Please feel free to reach out to me about this patient's care. He has a complex history of metastatic lung cancer.     Carola Rhine, PAC

## 2016-07-03 NOTE — Telephone Encounter (Signed)
Transferred call to radiation -pt in radiology for PET and asking who is following up on his results- he is having trouble walking and in pain. I told Otila Kluver that order initiated by radiation oncology.

## 2016-07-03 NOTE — Telephone Encounter (Signed)
Pt there for pain and med refills. ? Having trouble walking. Transferred call to XRT,

## 2016-07-03 NOTE — ED Provider Notes (Signed)
[facility] DEPT Provider Note   CSN: [id number] Arrival date & time: 07/03/16  0932     History   Chief Complaint Chief Complaint  Patient presents with  . body pain    HPI Herbert Smith is a 60 y.o. male who presents emergency Department with chief complaint of pain. He has a past medical history of metastatic non-small cell lung carcinoma and underwent induction therapy  and excisional surgery in [place]. The patient has had multiple laminectomies of hardware in the back. Patient states that he has a recurrence of the cancer in his spine. Patient is out of pain medications and states that no one will fill his medications. His last medications were filled by Dr. Earlie Server at the current oncology center. Patient states that he was dismissed to go. Follow-up with Dr. Ezekiel Slocumb, who is  an interventional pain management specialist with [facility]. The patient states that he does not have his appointment until November 14. He states his pain is so severe he is unable to stand and feels that he has progressive weakness. Patient denies any bowel or bladder incontinence. He denies saddle anesthesia.  HPI  Past Medical History:  Diagnosis Date  . Cancer ([place])    lung ca dx'd 2011  . Primary cancer of right upper lobe of lung ([place]) 09/12/2014    Patient Active Problem List   Diagnosis Date Noted  . Neoplasm related pain   . Palliative care encounter   . Constipation due to opioid therapy   . Tobacco abuse   . Metastatic cancer to spine ([place]) 06/02/2016  . Low back pain at multiple sites 06/02/2016  . Low back pain 06/02/2016  . Back pain 09/26/2014  . Primary cancer of right upper lobe of lung ([place]) 09/12/2014    Past Surgical History:  Procedure Laterality Date  . LAMINECTOMY  03/18/2014   T11-L1 laminectomy with right T11-12 nerve root division, and T11-L1 posterior spinal fusion       Home Medications    Prior to Admission medications   Medication  Sig Start Date End Date Taking? Authorizing Provider  acetaminophen (TYLENOL) 325 MG tablet Take 2 tablets (650 mg total) by mouth every 6 (six) hours as needed for mild pain (or Fever >/= 101). 06/05/16   Mir Marry Guan, MD  albuterol (PROVENTIL HFA;VENTOLIN HFA) 108 (90 BASE) MCG/ACT inhaler Inhale 2 puffs into the lungs every 6 (six) hours as needed for wheezing or shortness of breath.    Historical Provider, MD  albuterol (PROVENTIL) (2.5 MG/3ML) 0.083% nebulizer solution Take 2.5 mg by nebulization every 6 (six) hours as needed for wheezing or shortness of breath.    Historical Provider, MD  bisacodyl (DULCOLAX) 5 MG EC tablet Take 1 tablet (5 mg total) by mouth daily as needed for moderate constipation. 06/05/16   Mir Marry Guan, MD  cyclobenzaprine (FLEXERIL) 5 MG tablet Take 1 tablet (5 mg total) by mouth 3 (three) times daily as needed for muscle spasms. 06/05/16   Mir Marry Guan, MD  dexamethasone (DECADRON) 4 MG tablet Take 1 tablet (4 mg total) by mouth every 6 (six) hours. 06/05/16   Mir Marry Guan, MD  diphenhydrAMINE (BENADRYL) 25 MG tablet Take 50 mg by mouth at bedtime as needed for sleep.    Historical Provider, MD  feeding supplement, ENSURE ENLIVE, (ENSURE ENLIVE) LIQD Take 237 mLs by mouth 3 (three) times daily between meals. Patient not taking: Reported on 06/23/2016 06/05/16   Mir Marry Guan, MD  gabapentin (  NEURONTIN) 300 MG capsule Take 300 mg by mouth 3 (three) times daily.    Historical Provider, MD  oxyCODONE 10 MG TABS Take 1 tablet (10 mg total) by mouth every 3 (three) hours as needed for severe pain or breakthrough pain. 06/05/16   Mir Marry Guan, MD  oxyCODONE 30 MG 12 hr tablet Take 30 mg by mouth every 12 (twelve) hours. 06/05/16   Mir Marry Guan, MD  pantoprazole (PROTONIX) 40 MG tablet Take 1 tablet (40 mg total) by mouth daily. 06/05/16   Mir Marry Guan, MD  senna-docusate (SENOKOT-S) 8.6-50 MG tablet  Take 2 tablets by mouth 2 (two) times daily. 06/05/16   Mir Marry Guan, MD  tiotropium (SPIRIVA) 18 MCG inhalation capsule Place 18 mcg into inhaler and inhale 2 (two) times daily.    Historical Provider, MD    Family History No family history on file.  Social History Social History  Substance Use Topics  . Smoking status: Current Every Day Smoker    Packs/day: 1.50    Years: 15.00    Types: Cigarettes  . Smokeless tobacco: Never Used  . Alcohol use No     Allergies   Fentanyl   Review of Systems Review of Systems Ten systems reviewed and are negative for acute change, except as noted in the HPI.    Physical Exam Updated Vital Signs BP (!) 130/106 (BP Location: Left Arm)   Pulse (!) 50   Resp 18   SpO2 97%   Physical Exam  Constitutional: He appears well-developed and well-nourished. No distress.  HENT:  Head: Normocephalic and atraumatic.  Eyes: Conjunctivae are normal. No scleral icterus.  Neck: Normal range of motion. Neck supple.  Cardiovascular: Normal rate, regular rhythm and normal heart sounds.   Pulmonary/Chest: Effort normal and breath sounds normal. No respiratory distress.  Abdominal: Soft. There is no tenderness.  Musculoskeletal: He exhibits no edema.  Midline spinal tenderness   Neurological: He is alert.  Weakness, bilateral lower extremities   Skin: Skin is warm and dry. He is not diaphoretic.  Psychiatric: His behavior is normal.  Nursing note and vitals reviewed.    ED Treatments / Results  Labs (all labs ordered are listed, but only abnormal results are displayed) Labs Reviewed - No data to display  EKG  EKG Interpretation None       Radiology No results found.  Procedures Procedures (including critical care time)  Medications Ordered in ED Medications - No data to display   Initial Impression / Assessment and Plan / ED Course  I have reviewed the triage vital signs and the nursing notes.  Pertinent labs &  imaging results that were available during my care of the patient were reviewed by me and considered in my medical decision making (see chart for details).  Clinical Course   Patient had a PET scan done prior to arrival in the ED. PET scan doesn't show active metastatic disease at T12 and involving the rib cage. I had a conversation with PA Dara Lords in oncology regarding the patient. She states that this had multiple discussions about this patient and that he has essentially been signed off on in terms of pain management. The patient is supposed to follow up with pain management specialist. He is a poor prognosis and no masses felt. That can be done from the oncology side. Patient however is having significant pain, which is affecting his quality of life. I do not feel that he totally understands his prognosis. I discussed  this with the patient. I have also discussed the situation with my attending physician, Dr. Alvino Chapel. We agreed to give the patient his 30 mg OxyContin  twice a day dose, as well as his 10 mg oxycodone 3 times a day for breakthrough pain. Patient will receive one week's worth of medications. He has a follow-up appointment with radiology oncology. After discussing CT to clarify pain management and prognosis with his oncologist. Patient appears safe for discharge at this time.  Final Clinical Impressions(s) / ED Diagnoses   Final diagnoses:  None    New Prescriptions New Prescriptions   No medications on file     Margarita Mail, PA-C 07/03/16 [place], MD 07/06/16 [phone]

## 2016-07-03 NOTE — ED Notes (Signed)
Bed: WA01 Expected date:  Expected time:  Means of arrival:  Comments: 

## 2016-07-07 ENCOUNTER — Telehealth: Payer: Self-pay | Admitting: Medical Oncology

## 2016-07-07 NOTE — Telephone Encounter (Signed)
Dr Maryjean Ka will see pt nov 14th at 2 pm. Pt keeps calling their office for pain medication . They will not prescribe anything until he is evaluated. Will Dr Julien Nordmann or Radiation be managing his pain until he can be seen by Dr Lovenia Shuck .Please call Anderson Malta back so she knows how to direct the pt. CC Mohamed and xrt.

## 2016-07-07 NOTE — Telephone Encounter (Signed)
Ok we can refill his medications until he sees him in November if if He does not show up for the appointment, I will stop Rx his pain medications.

## 2016-07-08 NOTE — Telephone Encounter (Signed)
Pain medication Oxycontin '30mg'$  q 12hr q14 R-0 and Oxycodone '10mg'$  TID Q21 R-0 filled by Margarita Mail PA-C on 10/13. Additional refills not due until Friday 10/20. Per MD ok to refill pain meds for pt until he is seen by Dr. Lovenia Shuck. If pt no shows appt, rx will not be refilled going forward.

## 2016-07-13 ENCOUNTER — Ambulatory Visit: Payer: Medicaid Other | Admitting: Radiation Oncology

## 2016-07-15 NOTE — Progress Notes (Signed)
Histology and Location of Primary Cancer: metastatic non small cell lung cancer  Sites of Visceral and Bony Metastatic Disease: spine  Location(s) of Symptomatic Metastases: epidural extension at the T11-L1  Past/Anticipated chemotherapy by medical oncology, if any:  PRIOR THERAPY: 1) status post induction chemotherapy with carboplatin and paclitaxel followed by a course of concurrent chemoradiation with carboplatin and paclitaxel completed in July 2013 with stable disease in the chest. This was under the care of Dr. Bradly Chris in [place]. 2) status post decompressive T11-L1 laminectomies, right T11-12 nerve root division and T11-L1 posterior spinal fusion on 03/18/2014. 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 The tumor was negative for EGFR mutation and negative for ALK gene translocation. 3) status post CyberKnife radiosurgery to the vertebral body tumor in early August 2015. ([facility] requested medical record and they were mailed [place] mail on Friday)  CURRENT THERAPY: Observation.  Pain on a scale of 0-10 is: low back pain that radiates down both legs and has begun running up his spine 10 on a scale of 0-10. Reports taking oxycontin 30 mg just prior to this encounter   If Spine Met(s), symptoms, if any, include:  Bowel/Bladder retention or incontinence (please describe): No but, does reports one episode of blood in his stool a week ago. Denies a hx of hemorrhoids.   Numbness or weakness in extremities (please describe): bilateral leg weakness   Current Decadron regimen, if applicable: decadron 4 mg tid  Ambulatory status? Walker? Wheelchair?: Ambulatory with cane  SAFETY ISSUES:  Prior radiation? yes  Pacemaker/ICD? no  Possible current pregnancy? no  Is the patient on methotrexate? no  Current Complaints / other details:  60 year old male. Staying with his girlfriend presently because he must  ambulate stairs to get to his bedroom at his home. Reports he plans to marry her December of this year. Explains he used to work as a Horticulturist, commercial thus, he has received treatment/care from where ever he was working at the time.

## 2016-07-16 ENCOUNTER — Ambulatory Visit: Payer: Medicaid Other

## 2016-07-16 ENCOUNTER — Ambulatory Visit: Payer: Medicaid Other | Admitting: Radiation Oncology

## 2016-07-16 ENCOUNTER — Ambulatory Visit
Admission: RE | Admit: 2016-07-16 | Discharge: 2016-07-16 | Disposition: A | Discharge status: Home / Self Care | Payer: Medicaid Other | Source: Ambulatory Visit | Attending: Radiation Oncology | Admitting: Radiation Oncology

## 2016-07-16 ENCOUNTER — Encounter: Payer: Self-pay | Admitting: Radiation Oncology

## 2016-07-16 VITALS — BP 118/76 | HR 49 | Resp 18 | Ht >= 80 in | Wt 183.0 lb

## 2016-07-16 DIAGNOSIS — Z510 Encounter for antineoplastic radiation therapy: Secondary | ICD-10-CM | POA: Insufficient documentation

## 2016-07-16 DIAGNOSIS — C7951 Secondary malignant neoplasm of bone: Secondary | ICD-10-CM

## 2016-07-16 DIAGNOSIS — M545 Low back pain, unspecified: Secondary | ICD-10-CM

## 2016-07-16 DIAGNOSIS — C3411 Malignant neoplasm of upper lobe, right bronchus or lung: Secondary | ICD-10-CM

## 2016-07-16 DIAGNOSIS — C7952 Secondary malignant neoplasm of bone marrow: Secondary | ICD-10-CM

## 2016-07-16 DIAGNOSIS — C3410 Malignant neoplasm of upper lobe, unspecified bronchus or lung: Secondary | ICD-10-CM | POA: Insufficient documentation

## 2016-07-16 DIAGNOSIS — F17210 Nicotine dependence, cigarettes, uncomplicated: Secondary | ICD-10-CM | POA: Insufficient documentation

## 2016-07-16 HISTORY — DX: Malignant neoplasm of bone and articular cartilage, unspecified: C41.9

## 2016-07-16 MED ORDER — OXYCODONE HCL 10 MG PO TABS: 10.0000 mg | ORAL_TABLET | ORAL | 0 refills | Status: DC | PRN

## 2016-07-16 MED ORDER — DEXAMETHASONE 4 MG PO TABS: 2.0000 mg | ORAL_TABLET | Freq: Two times a day (BID) | ORAL | 1 refills | Status: AC

## 2016-07-16 MED ORDER — OXYCODONE HCL ER 30 MG PO T12A: 30.0000 mg | EXTENDED_RELEASE_TABLET | Freq: Two times a day (BID) | ORAL | 0 refills | Status: DC

## 2016-07-16 MED ORDER — GABAPENTIN 300 MG PO CAPS: 300.0000 mg | ORAL_CAPSULE | Freq: Three times a day (TID) | ORAL | 3 refills | Status: AC | PRN

## 2016-07-16 MED ORDER — OXYCODONE-ACETAMINOPHEN 5-325 MG PO TABS
2.0000 | ORAL_TABLET | ORAL | Status: AC
  Administered 2016-07-16: 2 via ORAL
  Filled 2016-07-16: qty 2

## 2016-07-16 NOTE — Progress Notes (Signed)
Radiation Oncology         [phone] ________________________________  Name: Herbert Smith MRN: [id number]         Date: 06/03/16              DOB: 1956-05-10  FI:EPPI-RJJOA,CZYSAY, MD  No ref. provider found     REFERRING PHYSICIAN: Dr. Collier [place]ina   DIAGNOSIS:  Low back pain at multiple sites - Plan: Ambulatory referral to [place]  Metastatic cancer to spine [facility]) - Plan: oxyCODONE-acetaminophen (PERCOCET/ROXICET) 5-325 MG per tablet 2 tablet, Ambulatory referral to [place]  Primary cancer of right upper lobe of lung ([place]) - Plan: Ambulatory referral to [place]  Primary cancer of right upper lobe of lung ([place]) - Plan: Ambulatory referral to [place]: Herbert Smith is a 60 y.o. male originally seen in the hospital setting in August 2017,  for progressive back pain with a history of lung cancer. The patient used to reside in [place]. 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 He received carboplatin and paclitaxel followed by a course of concurrent chemoradiation with carboplatin and paclitaxel completed in July 2013 with stable disease in the chest. This was under the care of Dr. Bradly Chris. He was found to have recurrent disease in 2015 when he presented with a T12 vertebral body metastasis and cord compression. He underwent T11-L1 laminectomy with right T11-12 nerve root division, and T11-L1 posterior spinal fusion on 03/18/14. He subsequently was treated with Dr. Tacey Heap at [facility] in radiation oncology with cyberknife therapy in 5 fractions.   He relocated to [facility] about two years ago and has been followed in observation with Dr. Julien Nordmann. He continued to have low back pain following his procedure requiring narcotics, but presented to a spine specialist office because of progressive back pain in the thoracic and high lumbar region. An outside MRI revealed concerns for  recurrent disease within the T11-L1 vertebral bodies, and pathologic fracture of T12, with epidural soft tissue extending from T11-L1. He was ultimately admitted for management of back pain, and we are asked to consult on whether there is any feasibility for additional radiotherapy. At the time there was not felt to be much room for radiotherapy, and brain and spine conference did not feel that his pain was due to malignant features on MRI. It was recommended that he meet with Dr. Maryjean Ka for pain management and to undergo PET to determine if he had systemic disease that medical oncology could treat. He had a PET scan on 07/03/16 revealing hypermetabolic changes throughout the T12 vertebral body, paravertebral tissues, posterior elements, right twelfth rib, and along the spinal canal. The RLL also had a small nodular component of activity as well as right infrahilar node with mild activity. He comes today to discuss these findings. He has not met with pain management in neurosurgery, nor had any pain medication since an ED visit about 2 weeks ago. He has also been taking Dexamethasone intermittently since his hospitalization but without MD direction.   PREVIOUS RADIATION THERAPY: Yes   04/23/14-05/01/14: 60 Gy to T12 vertebral body in 5 fractions  July 2013: Definitive Radiotherapy to the chest    Past Medical History:  Past Medical History:  Diagnosis Date  . Cancer ([place])    lung ca dx'd 2011  . Primary cancer of right upper lobe of lung ([place]) 09/12/2014    Past Surgical History: Past Surgical History:  Procedure Laterality Date  . LAMINECTOMY  03/18/2014   T11-L1 laminectomy with right T11-12 nerve root division, and T11-L1 posterior spinal fusion    Social History:  Social History   Social History  . Marital status: Single    Spouse name: N/A  . Number of children: N/A  . Years of education: N/A   Occupational History  . Not on file.   Social History Main Topics  . Smoking  status: Current Every Day Smoker    Packs/day: 1.50    Years: 15.00    Types: Cigarettes  . Smokeless tobacco: Never Used  . Alcohol use No  . Drug use: No  . Sexual activity: Not on file   Other Topics Concern  . Not on file   Social History Narrative  . No narrative on file    Family History:No family history on file.   ALLERGIES:  Allergies  Allergen Reactions  . Fentanyl Itching      MEDICATIONS:  Current Outpatient Prescriptions on File Prior to Encounter  Medication Sig Dispense Refill  . acetaminophen (TYLENOL) 325 MG tablet Take 2 tablets (650 mg total) by mouth every 6 (six) hours as needed for mild pain (or Fever >/= 101). 60 tablet 0  . albuterol (PROVENTIL HFA;VENTOLIN HFA) 108 (90 BASE) MCG/ACT inhaler Inhale 2 puffs into the lungs every 6 (six) hours as needed for wheezing or shortness of breath.    Marland Kitchen albuterol (PROVENTIL) (2.5 MG/3ML) 0.083% nebulizer solution Take 2.5 mg by nebulization every 6 (six) hours as needed for wheezing or shortness of breath.    . bisacodyl (DULCOLAX) 5 MG EC tablet Take 1 tablet (5 mg total) by mouth daily as needed for moderate constipation. 30 tablet 1  . cyclobenzaprine (FLEXERIL) 5 MG tablet Take 1 tablet (5 mg total) by mouth 3 (three) times daily as needed for muscle spasms. 30 tablet 0  . dexamethasone (DECADRON) 4 MG tablet Take 1 tablet (4 mg total) by mouth every 6 (six) hours. 90 tablet 0  . diphenhydrAMINE (BENADRYL) 25 MG tablet Take 50 mg by mouth at bedtime as needed for sleep.    . feeding supplement, ENSURE ENLIVE, (ENSURE ENLIVE) LIQD Take 237 mLs by mouth 3 (three) times daily between meals. (Patient not taking: Reported on 06/23/2016) 237 mL 12  . gabapentin (NEURONTIN) 300 MG capsule Take 300 mg by mouth 3 (three) times daily.    Marland Kitchen oxyCODONE (OXYCONTIN) 30 MG 12 hr tablet Take 30 mg by mouth 2 (two) times daily. 14 each 0  . Oxycodone HCl 10 MG TABS Take 1 tablet (10 mg total) by mouth 3 (three) times daily as  needed. Severe or breakthrough pain 21 tablet 0  . pantoprazole (PROTONIX) 40 MG tablet Take 1 tablet (40 mg total) by mouth daily. 30 tablet 0  . senna-docusate (SENOKOT-S) 8.6-50 MG tablet Take 2 tablets by mouth 2 (two) times daily. 60 tablet 0  . tiotropium (SPIRIVA) 18 MCG inhalation capsule Place 18 mcg into inhaler and inhale 2 (two) times daily.     No current facility-administered medications on file prior to encounter.       REVIEW OF SYSTEMS: On review of systems, the patient reports that he is doing poorly overall. He continues to have progressive weakness of his lower extremities and has difficulty walking. He has not been able to go up and down stairs as well. He does not notice a difference in his symptoms if he takes steroids, but as above has not  had any direction since hospital discharge on how to take his medication. He denies any chest pain, shortness of breath, cough, fevers, chills, night sweats, or persistent unintended weight changes. He denies any bowel or bladder disturbances, and denies abdominal pain, nausea or vomiting. He denies any new musculoskeletal or joint aches or pains. A complete review of systems is obtained and is otherwise negative.    PHYSICAL EXAM:  Wt Readings from Last 3 Encounters:  06/23/16 187 lb 4.8 oz (85 kg)  06/02/16 182 lb 15.7 oz (83 kg)  03/23/16 185 lb 4.8 oz (84.1 kg)   Temp Readings from Last 3 Encounters:  07/03/16 97.5 F (36.4 C) (Oral)  06/23/16 98.6 F (37 C) (Oral)  06/05/16 98.1 F (36.7 C) (Oral)   BP Readings from Last 3 Encounters:  07/03/16 124/65  06/23/16 126/65  06/05/16 129/71   Pulse Readings from Last 3 Encounters:  07/03/16 (!) 49  06/23/16 64  06/05/16 (!) 47    Pain scale 10/10 In general this is an uncomfortable African American gentleman in no acute distress. He is alert and oriented x4 and appropriate throughout the examination. HEENT reveals that the patient is normocephalic, atraumatic. EOMs  are intact. PERRLA. Skin is intact without any evidence of gross lesions.  Cardiopulmonary assessment is negative for acute distress and he exhibits normal effort. Evaluation of the posterior thorax revealed a midline laminectomy incision running from approximately T10-L2 region.  He has palpable tenderness in the lower third of the incision site. He is neurologically intact in the upper extremities with 5/5 strength and in the lower extremities has 4/5 strength bilaterally and has intact soft touch bilaterally. Questionable foot drop is noted in the right lower extremity.    ECOG = 2  0 - Asymptomatic (Fully active, able to carry on all predisease activities without restriction)  1 - Symptomatic but completely ambulatory (Restricted in physically strenuous activity but ambulatory and able to carry out work of a light or sedentary nature. For example, light housework, office work)  2 - Symptomatic, <50% in bed during the day (Ambulatory and capable of all self care but unable to carry out any work activities. Up and about more than 50% of waking hours)  3 - Symptomatic, >50% in bed, but not bedbound (Capable of only limited self-care, confined to bed or chair 50% or more of waking hours)  4 - Bedbound (Completely disabled. Cannot carry on any self-care. Totally confined to bed or chair)  5 - Death   Eustace Pen MM, Creech RH, Tormey DC, et al. [phone]). "Toxicity and response criteria of the [facility]". [place] Oncol. 5 (6): 649-55    IMPRESSION/PLAN: 1.         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 After review of the patient's case at multiple conferences, there is still a question of whether or not he would be able to have another chance at radiotherapy. As Dr. Tammi Klippel is new to the patient's case, he would like to review his dosimetry records as well. In the meantime, he also suggests contacting Dr. Kathlene Cote and Dr. Julien Nordmann to  discuss options for biopsy which could gain insight on whether he may benefit from other systemic therapy like immunotherapy. I have looked back at his molecular studies from [facility] and it looks like he is negative for ALK and KRAS mutations. I will contact both IR and medical oncology and get back with the patient.  2.  Back pain secondary to #1. A prescription is provided for the patient to obtain Neurontin, Oxycodone, and Oxycontin again. I have also given him a new prescription for Dexamethasone '2mg'$  BID until we can sort out his options. In the meantime, the patient will keep [place] informed of any progressive neurologic symptoms. Unfortuantely if radiotherapy is not an option, and systemic therapy is not an option, he will likely have progressive symptoms that may not be able to be controlled and ultimately lead to permanent paralysis. We will continue to try to support him through this process and he is given contact information to keep [place] informed of his status.  The above documentation reflects my direct findings during this shared patient visit. Please see the separate note by Dr. Tammi Klippel on this date for the remainder of the patient's plan of care.     Carola Rhine, PAC

## 2016-07-16 NOTE — Progress Notes (Signed)
See progress noted under physician encounter.  

## 2016-07-20 ENCOUNTER — Ambulatory Visit: Payer: Medicaid Other

## 2016-07-20 ENCOUNTER — Telehealth: Payer: Self-pay | Admitting: Radiation Oncology

## 2016-07-20 ENCOUNTER — Ambulatory Visit: Payer: Medicaid Other | Admitting: Radiation Oncology

## 2016-07-20 NOTE — Telephone Encounter (Addendum)
I called the patient to follow up after our discussion. I left a message for him to call me back re: recommendations.   The patient called back today and we discussed that IR did not feel that he could safely have a biopsy of the retrocrural node, and Dr. Julien Nordmann does not feel that there is a role for immunotherapy as the patient is steroid dependant and that this could amplify his symptoms. He will see Dr. Julien Nordmann to discuss chemotherapy however on 07/30/16. Dr. Tammi Klippel has offered palliative radiotherapy with IMRT, over 10 fractions to the spine for pain relief and to see if this will reverse some of his neurologic symptoms. The patient is counseled on the risk of spinal cord injury from retreatment, however we feel very strongly that he would continue to progress toward permanent paralysis if he does nothing. We discussed the risks, benefits, short, and long term effects of radiotherapy, and the patient is interested in proceeding. He will come in for simulation tomorrow.

## 2016-07-21 ENCOUNTER — Ambulatory Visit
Admission: RE | Admit: 2016-07-21 | Discharge: 2016-07-21 | Disposition: A | Discharge status: Home / Self Care | Payer: Medicaid Other | Source: Ambulatory Visit | Attending: Radiation Oncology | Admitting: Radiation Oncology

## 2016-07-21 ENCOUNTER — Other Ambulatory Visit: Payer: Self-pay | Admitting: Radiation Oncology

## 2016-07-21 DIAGNOSIS — M5441 Lumbago with sciatica, right side: Principal | ICD-10-CM

## 2016-07-21 DIAGNOSIS — Z510 Encounter for antineoplastic radiation therapy: Secondary | ICD-10-CM | POA: Diagnosis not present

## 2016-07-21 DIAGNOSIS — G8929 Other chronic pain: Secondary | ICD-10-CM

## 2016-07-21 DIAGNOSIS — C7951 Secondary malignant neoplasm of bone: Secondary | ICD-10-CM

## 2016-07-21 NOTE — Progress Notes (Signed)
  Radiation Oncology         [phone]) [phone] ________________________________  Name: Herbert Smith MRN: [id number]  Date: 07/21/2016  DOB: Jul 04, 1956  STEREOTACTIC BODY RADIOTHERAPY SIMULATION AND TREATMENT PLANNING NOTE    ICD-9-CM ICD-10-CM   1. 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  Identity was confirmed.  All relevant records and images related to the planned course of therapy were reviewed.  The patient freely provided informed written consent to proceed with treatment after reviewing the details related to the planned course of therapy. The consent form was witnessed and verified by the simulation staff.  Then, the patient was set-up in a stable reproducible  supine position for radiation therapy.  A BodyFix immobilization pillow was fabricated for reproducible positioning.  Surface markings were placed.  The CT images were loaded into the planning software.  The gross target volumes (GTV) and planning target volumes (PTV) were delinieated, and avoidance structures were contoured.  Treatment planning then occurred.  The radiation prescription was entered and confirmed.  A total of two complex treatment devices were fabricated in the form of the BodyFix immobilization pillow and a neck accuform cushion.  I have requested : 3D Simulation  I have requested a DVH of the following structures: targets and all normal structures near the target including one as noted on the radiation plan to maintain doses in adherence with established limits  SPECIAL TREATMENT PROCEDURE:  The planned course of therapy using radiation constitutes a special treatment procedure. Special care is required in the management of this patient for the following reasons.  I have requested : This treatment  constitutes a Special Treatment Procedure for the following reason: [ Retreatment in a previously radiated area requiring careful monitoring of increased risk of toxicity due to overlap of previous treatment..   The special nature of the planned course of radiotherapy will require increased physician supervision and oversight to ensure patient's safety with optimal treatment outcomes.  PLAN:  The patient will receive 30 Gy in 10 fractions.  ________________________________  Sheral Apley Tammi Klippel, M.D.

## 2016-07-22 ENCOUNTER — Telehealth: Payer: Self-pay | Admitting: *Deleted

## 2016-07-22 NOTE — Telephone Encounter (Signed)
Oncology Nurse Navigator Documentation  Oncology Nurse Navigator Flowsheets 07/22/2016  Navigator Location [facility]  Navigator Encounter Type Telephone/per Dr. Julien Nordmann, I called patient to set up for [place] next week.  I left vm message with my name and phone number to call.   Telephone Outgoing Call  Treatment Phase Treatment  Barriers/Navigation Needs Coordination of Care  Interventions Coordination of Care  Coordination of Care Appts  Acuity Level 1  Time Spent with Patient 15

## 2016-07-23 ENCOUNTER — Telehealth: Payer: Self-pay | Admitting: *Deleted

## 2016-07-23 NOTE — Telephone Encounter (Signed)
Called patient home phone no answer, called his cell phone [phone], spoke with patient, he didn't know of any appt today with [facility], was hard to hear him and tried to speak again, but patient hung up or phone was disconnected, Romie Jumper spoke with [facility] yeatsrday at [place], she said they were going to calltpatient today, I tried calling [phone] 2 different times, call wouldn't go through,Shirley will refax [facility] and ask them to cal [place] or patient 9:41 AM

## 2016-07-24 ENCOUNTER — Telehealth: Payer: Self-pay | Admitting: *Deleted

## 2016-07-24 NOTE — Telephone Encounter (Signed)
Oncology Nurse Navigator Documentation  Oncology Nurse Navigator Flowsheets 07/24/2016  Navigator Location [facility]  Navigator Encounter Type Telephone/I called Mr. Pucci to set up to see Dr. Julien Nordmann next week.  I was unable to reach.  I left vm message with my name and phone number to call.   Telephone Outgoing Call  Treatment Phase Treatment  Barriers/Navigation Needs Coordination of Care  Interventions Coordination of Care  Coordination of Care Appts  Acuity Level 1  Time Spent with Patient 15

## 2016-07-27 DIAGNOSIS — Z510 Encounter for antineoplastic radiation therapy: Secondary | ICD-10-CM | POA: Diagnosis not present

## 2016-07-28 DIAGNOSIS — Z510 Encounter for antineoplastic radiation therapy: Secondary | ICD-10-CM | POA: Diagnosis not present

## 2016-07-30 ENCOUNTER — Telehealth: Payer: Self-pay | Admitting: *Deleted

## 2016-07-30 ENCOUNTER — Ambulatory Visit
Admission: RE | Admit: 2016-07-30 | Discharge: 2016-07-30 | Disposition: A | Discharge status: Home / Self Care | Payer: Medicaid Other | Source: Ambulatory Visit | Attending: Radiation Oncology | Admitting: Radiation Oncology

## 2016-07-30 DIAGNOSIS — Z510 Encounter for antineoplastic radiation therapy: Secondary | ICD-10-CM | POA: Diagnosis not present

## 2016-07-30 NOTE — Telephone Encounter (Signed)
Oncology Nurse Navigator Documentation  Oncology Nurse Navigator Flowsheets 07/30/2016  Navigator Location [facility]  Navigator Encounter Type Clinic/MDC/I spoke with Herbert Smith today.  He is scheduled to see Herbert Smith later on today. Herbert Smith wanted appt to be changed.  I will update scheduling to change appt  Patient Visit Type RadOnc  Treatment Phase Treatment  Barriers/Navigation Needs Coordination of Care  Interventions Coordination of Care  Coordination of Care Appts  Acuity Level 2  Acuity Level 2 Educational needs  Time Spent with Patient 63

## 2016-07-31 ENCOUNTER — Ambulatory Visit
Admission: RE | Admit: 2016-07-31 | Discharge: 2016-07-31 | Disposition: A | Discharge status: Home / Self Care | Payer: Medicaid Other | Source: Ambulatory Visit | Attending: Radiation Oncology | Admitting: Radiation Oncology

## 2016-07-31 VITALS — BP 125/75 | HR 70 | Resp 16 | Wt 193.0 lb

## 2016-07-31 DIAGNOSIS — Z510 Encounter for antineoplastic radiation therapy: Secondary | ICD-10-CM | POA: Diagnosis not present

## 2016-07-31 DIAGNOSIS — C7951 Secondary malignant neoplasm of bone: Secondary | ICD-10-CM

## 2016-07-31 DIAGNOSIS — C3411 Malignant neoplasm of upper lobe, right bronchus or lung: Secondary | ICD-10-CM

## 2016-07-31 MED ORDER — OXYCODONE HCL 10 MG PO TABS: 10.0000 mg | ORAL_TABLET | ORAL | 0 refills | Status: DC | PRN

## 2016-07-31 NOTE — Progress Notes (Signed)
  Radiation Oncology         [phone]   Name: Herbert Smith MRN: [id number]   Date: 07/31/2016  DOB: November 11, 1955   Weekly Radiation Therapy Management    ICD-9-CM ICD-10-CM   1. Metastatic cancer to spine ([facility]) 198.5 C79.51 Oxycodone HCl 10 MG TABS  2. Primary cancer of right upper lobe of lung ([facility]) 162.3 C34.11     Current Dose: 6 Gy  Planned Dose:  30 Gy  Narrative The patient presents for routine under treatment assessment.  Reports thoracic spine pain as a 6/10 that radiates around to his ribs. Reports taking oxycontin 30 mg every 12 hours and oxycodone 10 mg every 3 hours (2 tabs) for breakthrough pain. Continues to take decadron 2 mg bid. Thrush noted by the nurse. Ambulatory with aid of cane with intermittent tingling in femur area of lower extremity. Reports managing constipation with miralax and laxatives. Patient appears more comfortable today than on consult day.   Set-up films were reviewed. The chart was checked.  Physical Findings  weight is 193 lb (87.5 kg). His blood pressure is 125/75 and his pulse is 70. His respiration is 16 and oxygen saturation is 100%.   Weight essentially stable.  No significant changes. Presents in a wheelchair. Oral cavity is clear of thrush.  Impression The patient is tolerating radiation.  Plan Continue treatment as planned. I refilled the patient's Oxycodone 10 mg. The printer was down, therefore I wrote out the prescription and gave it to the patient. Keep the same dose of Decadron for now.         Sheral Apley Tammi Klippel, M.D.  This document serves as a record of services personally performed by Tyler Pita, MD. It was created on his behalf by Darcus Austin, a trained medical scribe. The creation of this record is based on the scribe's personal observations and the provider's statements to them. This document has been checked and approved by the attending provider.

## 2016-07-31 NOTE — Progress Notes (Signed)
Weight and vitals stable. Reports thoracic spine pain 6 on a scale of 0-10 that radiates around to his ribs. Reports taking oxycontin 30 mg every 12 hours and oxycodone 10 mg every 3 hours (2 tabs) for break through pain. Continues to take decadron 2 mg bid. Thrush noted. Ambulatory with aid of cane with intermittent tingling in femur area of lower extremity. Reports managing constipation with miralax and laxatives. Patient appears more comfortable today than on consult day.   BP 125/75   Pulse 70   Resp 16   Wt 193 lb (87.5 kg)   SpO2 100%   BMI 21.20 kg/m  Wt Readings from Last 3 Encounters:  07/31/16 193 lb (87.5 kg)  07/16/16 183 lb (83 kg)  06/23/16 187 lb 4.8 oz (85 kg)

## 2016-07-31 NOTE — Addendum Note (Signed)
Encounter addended by: Heywood Footman, RN on: 07/31/2016  5:37 PM<BR>    Actions taken: Order Reconciliation Section accessed, Home Medications modified

## 2016-08-03 ENCOUNTER — Encounter: Payer: Self-pay | Admitting: *Deleted

## 2016-08-03 ENCOUNTER — Telehealth: Payer: Self-pay | Admitting: Internal Medicine

## 2016-08-03 ENCOUNTER — Ambulatory Visit
Admission: RE | Admit: 2016-08-03 | Discharge: 2016-08-03 | Disposition: A | Discharge status: Home / Self Care | Payer: Medicaid Other | Source: Ambulatory Visit | Attending: Radiation Oncology | Admitting: Radiation Oncology

## 2016-08-03 DIAGNOSIS — Z510 Encounter for antineoplastic radiation therapy: Secondary | ICD-10-CM | POA: Diagnosis not present

## 2016-08-03 NOTE — Progress Notes (Signed)
Oncology Nurse Navigator Documentation  Oncology Nurse Navigator Flowsheets 08/03/2016  Navigator Location [facility]  Navigator Encounter Type Other/notifed scheduling to call and schedule patient to see Dr. Julien Nordmann next week.   Barriers/Navigation Needs Coordination of Care  Interventions Coordination of Care  Coordination of Care Appts  Acuity Level 1  Acuity Level 1 Minimal follow up required  Time Spent with Patient 15

## 2016-08-03 NOTE — Telephone Encounter (Signed)
lvm to inform pt of added MD appt to 11/22 visit date per LOS

## 2016-08-04 ENCOUNTER — Ambulatory Visit
Admission: RE | Admit: 2016-08-04 | Discharge: 2016-08-04 | Disposition: A | Discharge status: Home / Self Care | Payer: Medicaid Other | Source: Ambulatory Visit | Attending: Radiation Oncology | Admitting: Radiation Oncology

## 2016-08-04 DIAGNOSIS — Z510 Encounter for antineoplastic radiation therapy: Secondary | ICD-10-CM | POA: Diagnosis not present

## 2016-08-05 ENCOUNTER — Ambulatory Visit
Admission: RE | Admit: 2016-08-05 | Discharge: 2016-08-05 | Disposition: A | Discharge status: Home / Self Care | Payer: Medicaid Other | Source: Ambulatory Visit | Attending: Radiation Oncology | Admitting: Radiation Oncology

## 2016-08-05 DIAGNOSIS — Z510 Encounter for antineoplastic radiation therapy: Secondary | ICD-10-CM | POA: Diagnosis not present

## 2016-08-06 ENCOUNTER — Ambulatory Visit
Admission: RE | Admit: 2016-08-06 | Discharge: 2016-08-06 | Disposition: A | Discharge status: Home / Self Care | Payer: Medicaid Other | Source: Ambulatory Visit | Attending: Radiation Oncology | Admitting: Radiation Oncology

## 2016-08-06 VITALS — BP 139/108 | HR 68 | Resp 18

## 2016-08-06 DIAGNOSIS — C3411 Malignant neoplasm of upper lobe, right bronchus or lung: Secondary | ICD-10-CM

## 2016-08-06 DIAGNOSIS — Z510 Encounter for antineoplastic radiation therapy: Secondary | ICD-10-CM | POA: Diagnosis not present

## 2016-08-06 NOTE — Progress Notes (Signed)
Unable to obtain weight today. Patient unable to stand due to pain and fear of falling. Reports thoracic spine pain 8 on a scale of 0-10 radiating around to his ribs. Reports taking oxycontin 30 mg every 12 hours and oxycodone 10 mg every 3 hours (2 tablets) prn. Continues decadron 2 mg bid. No evidence of thrush noted. Denies sore throat. Reports productive cough with green sputum. Reports he had a bowel movement yesterday (small) but, a larger one the day before. Patient appears fatigued today. Patient rocking due to pain.   BP (!) 139/108 (BP Location: Left Arm, Patient Position: Sitting, Cuff Size: Normal)   Pulse 68   Resp 18   SpO2 98%  Wt Readings from Last 3 Encounters:  07/31/16 193 lb (87.5 kg)  07/16/16 183 lb (83 kg)  06/23/16 187 lb 4.8 oz (85 kg)

## 2016-08-06 NOTE — Progress Notes (Signed)
  Radiation Oncology         [phone]   Name: Herbert Smith MRN: [id number]   Date: 08/06/2016  DOB: Apr 16, 1956   Weekly Radiation Therapy Management    ICD-9-CM ICD-10-CM   1. Primary cancer of right upper lobe of lung ([facility]) 162.3 C34.11     Current Dose: 18 Gy  Planned Dose:  30 Gy  Narrative The patient presents for routine under treatment assessment.  The patient unable to stand due to pain and fear of falling, therefore he was not weighed. He reports his legs are so weak that he had to crawl out of the bathroom this morning. Reports thoracic spine pain as an 8/10 radiating around to his ribs. Reports taking oxycontin 30 mg every 12 hours and oxycodone 10 mg every 3 hours (2 tablets) prn. Continues decadron 2 mg bid. No evidence of thrush noted by the nurse. Denies a sore throat or an upset stomach. Reports productive cough with green sputum. Reports he had a small bowel movement yesterday, but a larger one the day before. Patient appears fatigued today. Patient rocking due to pain.  Set-up films were reviewed. The chart was checked.  Physical Findings  blood pressure is 139/108 (abnormal) and his pulse is 68. His respiration is 18 and oxygen saturation is 98%.   Weight essentially stable.  No significant changes. Presents in a wheelchair. Oral cavity is clear of thrush.  Impression The patient is tolerating radiation.  Plan Continue treatment as planned. Continue Decadron 2 mg BID. When he completes treatment we will taper it to 2 mg once a day.         Sheral Apley Tammi Klippel, M.D.  This document serves as a record of services personally performed by Tyler Pita, MD. It was created on his behalf by Darcus Austin, a trained medical scribe. The creation of this record is based on the scribe's personal observations and the provider's statements to them. This document has been checked and approved by the attending provider.

## 2016-08-07 ENCOUNTER — Ambulatory Visit
Admission: RE | Admit: 2016-08-07 | Discharge: 2016-08-07 | Disposition: A | Discharge status: Home / Self Care | Payer: Medicaid Other | Source: Ambulatory Visit | Attending: Radiation Oncology | Admitting: Radiation Oncology

## 2016-08-07 DIAGNOSIS — Z510 Encounter for antineoplastic radiation therapy: Secondary | ICD-10-CM | POA: Diagnosis not present

## 2016-08-10 ENCOUNTER — Ambulatory Visit
Admission: RE | Admit: 2016-08-10 | Discharge: 2016-08-10 | Disposition: A | Discharge status: Home / Self Care | Payer: Medicaid Other | Source: Ambulatory Visit | Attending: Radiation Oncology | Admitting: Radiation Oncology

## 2016-08-10 VITALS — BP 100/70 | HR 77 | Temp 98.2°F | Resp 18

## 2016-08-10 DIAGNOSIS — G893 Neoplasm related pain (acute) (chronic): Secondary | ICD-10-CM

## 2016-08-10 DIAGNOSIS — Z510 Encounter for antineoplastic radiation therapy: Secondary | ICD-10-CM | POA: Diagnosis not present

## 2016-08-10 MED ORDER — HYDROMORPHONE HCL 1 MG/ML IJ SOLN
1.0000 mg | INTRAMUSCULAR | Status: AC
  Administered 2016-08-10: 0.5 mg via INTRAVENOUS
  Filled 2016-08-10: qty 1

## 2016-08-10 MED ORDER — HYDROMORPHONE HCL 4 MG/ML IJ SOLN
1.0000 mg | INTRAMUSCULAR | Status: DC
  Filled 2016-08-10: qty 1

## 2016-08-10 NOTE — Progress Notes (Signed)
Phoned to [facility] because patient is in pain. Patient reports low t spine pain 12 on a scale of 0-10. Patient reports taking Oxycontin 30 mg every 12 hours and oxycodone 10 mg every three hours. Patient reports taking oxycodone 10 mg just one hour ago. Perkins PA ordered 1 mg dilaudid IM. Denies feeling lightheaded or dizzy. BP low at 100/70 with heart rate of 77.  Administered 0.5 mg right deltoid due to low bp. Patient tolerated this well. Applied a bandaid to the injection site. Informed Perkins PA of my actions. Patient reports he has fallen over the weekend. Also, he reports he went to sleep at 10 pm last night and slept until 1 pm today. He explains he is sleeping more. Reports a productive cough with green sputum. Denies hemoptysis. Reports cough is worse when he lays flat. Denies headache, dizziness, nausea, or vomiting. Denies visual or auditory disturbance. Patient alert and oriented x 3. Reports he had a bowel movement today. Patient appears fatigue and restless. Patient was able to complete radiation treatment. A friend will drive him home. At 1620 patient reports low T spine pain 10 on a scale of 0-10.

## 2016-08-11 ENCOUNTER — Ambulatory Visit
Admission: RE | Admit: 2016-08-11 | Discharge: 2016-08-11 | Disposition: A | Discharge status: Home / Self Care | Payer: Medicaid Other | Source: Ambulatory Visit | Attending: Radiation Oncology | Admitting: Radiation Oncology

## 2016-08-11 DIAGNOSIS — Z510 Encounter for antineoplastic radiation therapy: Secondary | ICD-10-CM | POA: Diagnosis not present

## 2016-08-12 ENCOUNTER — Ambulatory Visit
Admission: RE | Admit: 2016-08-12 | Discharge: 2016-08-12 | Disposition: A | Discharge status: Home / Self Care | Payer: Medicaid Other | Source: Ambulatory Visit | Attending: Radiation Oncology | Admitting: Radiation Oncology

## 2016-08-12 ENCOUNTER — Ambulatory Visit (HOSPITAL_BASED_OUTPATIENT_CLINIC_OR_DEPARTMENT_OTHER): Payer: Medicaid Other | Admitting: Internal Medicine

## 2016-08-12 ENCOUNTER — Telehealth: Payer: Self-pay | Admitting: *Deleted

## 2016-08-12 ENCOUNTER — Encounter: Payer: Self-pay | Admitting: Radiation Oncology

## 2016-08-12 ENCOUNTER — Encounter: Payer: Self-pay | Admitting: Internal Medicine

## 2016-08-12 ENCOUNTER — Ambulatory Visit (HOSPITAL_BASED_OUTPATIENT_CLINIC_OR_DEPARTMENT_OTHER): Payer: Medicaid Other

## 2016-08-12 VITALS — BP 97/84 | HR 67 | Resp 18 | Wt 188.3 lb

## 2016-08-12 VITALS — BP 97/71 | HR 100 | Temp 97.7°F | Resp 16 | Ht >= 80 in | Wt 188.3 lb

## 2016-08-12 DIAGNOSIS — G893 Neoplasm related pain (acute) (chronic): Secondary | ICD-10-CM | POA: Diagnosis not present

## 2016-08-12 DIAGNOSIS — M545 Low back pain, unspecified: Secondary | ICD-10-CM

## 2016-08-12 DIAGNOSIS — R262 Difficulty in walking, not elsewhere classified: Secondary | ICD-10-CM

## 2016-08-12 DIAGNOSIS — C3411 Malignant neoplasm of upper lobe, right bronchus or lung: Secondary | ICD-10-CM

## 2016-08-12 DIAGNOSIS — C7951 Secondary malignant neoplasm of bone: Secondary | ICD-10-CM

## 2016-08-12 DIAGNOSIS — Z510 Encounter for antineoplastic radiation therapy: Secondary | ICD-10-CM | POA: Diagnosis not present

## 2016-08-12 DIAGNOSIS — Z5111 Encounter for antineoplastic chemotherapy: Secondary | ICD-10-CM

## 2016-08-12 HISTORY — DX: Encounter for antineoplastic chemotherapy: Z51.11

## 2016-08-12 MED ORDER — DEXAMETHASONE 4 MG PO TABS: ORAL_TABLET | ORAL | 1 refills | Status: DC

## 2016-08-12 MED ORDER — FOLIC ACID 1 MG PO TABS: 1.0000 mg | ORAL_TABLET | Freq: Every day | ORAL | 4 refills | Status: DC

## 2016-08-12 MED ORDER — PROCHLORPERAZINE MALEATE 10 MG PO TABS: 10.0000 mg | ORAL_TABLET | Freq: Four times a day (QID) | ORAL | 0 refills | Status: DC | PRN

## 2016-08-12 MED ORDER — CYANOCOBALAMIN 1000 MCG/ML IJ SOLN
1000.0000 ug | Freq: Once | INTRAMUSCULAR | Status: AC
  Administered 2016-08-12: 1000 ug via INTRAMUSCULAR

## 2016-08-12 NOTE — Progress Notes (Signed)
START ON PATHWAY REGIMEN - Non-Small Cell Lung  [id number]: Carboplatin AUC=5 + Pemetrexed 500 mg/m2 + Bevacizumab 15 mg/kg q21 Days x 4 Cycles   A cycle is every 21 days:     Carboplatin (Paraplatin(R)) AUC=5 in 250 mL NS IV over 1 hour Dose Mod: None     Pemetrexed (Alimta(R)) 500 mg/m2 in 100 mL NS IV over 10 minutes, manufacturer recommends not administering to patients with CrCl < 45 mL/min Dose Mod: None     Bevacizumab (Avastin(R)) 15 mg/kg in 100 mL NS IV over 90 minutes first infusion, 60 minutes second infusion and 30 minutes all subsequent infusions if tolerated Dose Mod: None Additional Orders: * All AUC calculations intended to be used in [employer] formula Note: Patient to receive the following prior to the initiation of therapy: 1) Dexamethasone 4 mg orally twice daily x 6 doses.  First dose 24 hours before chemotherapy. 2) Folic acid >= 909 mcg orally daily.  First dose at least 5 days prior to the first dose of pemetrexed. 3) Vitamin B12 1,000 mcg intramuscularly every 9 weeks.  First dose at least 5 days prior to the first dose of pemetrexed.  **Always confirm dose/schedule in your pharmacy ordering system**    Patient Characteristics: Stage IV Metastatic, Non Squamous, Initial Chemotherapy/Immunotherapy, PS = 0, 1, PD-L1 Expression Positive 1-49% (TPS) / Negative / Not Tested / Not a Candidate for Immunotherapy Check here if patient was staged using an edition prior to AJCC Staging - 8th Edition (i.e., prior to September 21, 2016)? false AJCC T Category: T2a Current Disease Status: Distant Metastases AJCC N Category: N3 AJCC M Category: M1c AJCC 8 Stage Grouping: IVB Histology: Non Squamous Cell ROS1 Rearrangement Status: Did Not Order Test T790M Mutation Status: Not Applicable - EGFR Mutation Negative/Unknown Other Mutations/Biomarkers: No Other Actionable Mutations PD-L1 Expression Status: Quantity Not Sufficient Chemotherapy/Immunotherapy LOT: Initial  Chemotherapy/Immunotherapy Molecular Targeted Therapy: Not Appropriate ALK Translocation Status: Negative Would you be surprised if this patient died  in the next year? I would NOT be surprised if this patient died in the next year EGFR Mutation Status: Negative/Wild Type BRAF [id number] Mutation Status: Did Not Order Test Performance Status: PS = 0, 1  Intent of Therapy: Non-Curative / Palliative Intent, Discussed with Patient

## 2016-08-12 NOTE — Progress Notes (Addendum)
Weight and vitals stable. Reports thoracic spine pain 10 on a scale of 0-10. Reports continued weakness of lower extremities. Reports taking OxyContin 30 mg every 12 hours and oxycodone 10 mg 1 or 2 tablets every 3-4 hours. Reports taking decadron 2 mg bid as directed. No evidence of thrush noted. Denies incontinence of bowel or bladder. Denies constipation. No edema of bilateral lower extremities noted. Denies nausea or vomiting. Reports he fell without injury on Sunday. One month follow up appointment card given. Seen by Julien Nordmann today and plans to start chemotherapy on 08/26/16.  BP 97/84 (BP Location: Left Arm, Patient Position: Sitting, Cuff Size: Normal)   Pulse 67   Resp 18   Wt 188 lb 4.8 oz (85.4 kg)   SpO2 100%   BMI 20.69 kg/m  Wt Readings from Last 3 Encounters:  08/12/16 188 lb 4.8 oz (85.4 kg)  08/12/16 188 lb 4.8 oz (85.4 kg)  07/31/16 193 lb (87.5 kg)

## 2016-08-12 NOTE — Telephone Encounter (Signed)
Oncology Nurse Navigator Documentation  Oncology Nurse Navigator Flowsheets 08/03/2016  Navigator Location [facility]  Navigator Encounter Type Other/I called to follow up with Mr. Purnell.  He is coming to see Dr. Julien Nordmann today.  He is coming to appt  Barriers/Navigation Needs Coordination of Care  Interventions Coordination of Care  Coordination of Care Appts  Acuity Level 1  Acuity Level 1 Minimal follow up required  Time Spent with Patient 15

## 2016-08-12 NOTE — Progress Notes (Signed)
[place] [phone]   [phone]) [phone]  OFFICE PROGRESS NOTE  Benito Mccreedy, MD [address] [place] [address]  DIAGNOSIS: Metastatic non-small cell lung cancer, squamous cell carcinoma initially diagnosed as a stage IIIB in March 2011 in [place] THERAPY: 1) status post induction chemotherapy with carboplatin and paclitaxel followed by a course of concurrent chemoradiation with carboplatin and paclitaxel completed in July 2013 with stable disease in the chest. This was under the care of Dr. Bradly Chris in [place]. 2) status post decompressive T11-L1 laminectomies, right T11-12 nerve root division and T11-L1 posterior spinal fusion on 03/18/2014. 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 The tumor was negative for EGFR mutation and negative for ALK gene translocation. 3) status post CyberKnife radiosurgery to the vertebral body tumor in early August 2015. 4) palliative radiotherapy again to the metastatic lesions at T11-L1 under the care of Dr. Tammi Klippel. Last fraction of treatment today.  CURRENT THERAPY: Systemic chemotherapy with carboplatin for AUC of 5, Alimta 500 MG/M2 and Avastin 15 MG/KG every 3 weeks. First dose 08/26/2016.  INTERVAL HISTORY: Herbert Smith 60 y.o. male returns to the clinic today for follow-up visit accompanied by a family member. The patient is feeling a little bit better today but continues to have weakness in the lower extremities as well as the low back pain. He is currently on pain medication in the form of OxyContin 30 mg by mouth every 12 hours in addition to oxycodone 10 mg (2 tablets) every 4 hours as needed for pain. He was referred to a pain clinic specialist, Dr. Lovenia Shuck but he missed his appointment. He is tolerating the treatment with radiotherapy well. He denied having any incontinence to urine or  stool. He denied having any fever or chills. He has no nausea or vomiting. He is here today for evaluation and recommendation regarding treatment of his condition.  MEDICAL HISTORY: Past Medical History:  Diagnosis Date  . 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 Cancer ([place])    lung ca dx'd 2011  . Primary cancer of right upper lobe of lung ([facility]) 09/12/2014    ALLERGIES:  is allergic to fentanyl.  MEDICATIONS:  Current Outpatient Prescriptions  Medication Sig Dispense Refill  . acetaminophen (TYLENOL) 325 MG tablet Take 2 tablets (650 mg total) by mouth every 6 (six) hours as needed for mild pain (or Fever >/= 101). 60 tablet 0  . albuterol (PROVENTIL HFA;VENTOLIN HFA) 108 (90 BASE) MCG/ACT inhaler Inhale 2 puffs into the lungs every 6 (six) hours as needed for wheezing or shortness of breath.    Marland Kitchen albuterol (PROVENTIL) (2.5 MG/3ML) 0.083% nebulizer solution Take 2.5 mg by nebulization every 6 (six) hours as needed for wheezing or shortness of breath.    . bisacodyl (DULCOLAX) 5 MG EC tablet Take 1 tablet (5 mg total) by mouth daily as needed for moderate constipation. 30 tablet 1  . castor oil liquid Take 5 mLs by mouth daily as needed for moderate constipation.    . cyclobenzaprine (FLEXERIL) 5 MG tablet Take 1 tablet (5 mg total) by mouth 3 (three) times daily as needed for muscle spasms. 30 tablet 0  . dexamethasone (DECADRON) 4 MG tablet Take 0.5 tablets (2 mg total) by mouth 2 (two) times daily. 60 tablet 1  . diphenhydrAMINE (BENADRYL) 25 MG tablet Take 50 mg by mouth at bedtime as needed for sleep.    Marland Kitchen  feeding supplement, ENSURE ENLIVE, (ENSURE ENLIVE) LIQD Take 237 mLs by mouth 3 (three) times daily between meals. 237 mL 12  . gabapentin (NEURONTIN) 300 MG capsule Take 1 capsule (300 mg total) by mouth 3 (three) times daily as needed. 90 capsule 3  . oxyCODONE (OXYCONTIN) 30 MG 12 hr tablet Take 30 mg by mouth 2 (two) times daily. 60 each 0  . Oxycodone HCl 10 MG TABS Take 1-2 tablets  (10-20 mg total) by mouth every 4 (four) hours as needed. Severe or breakthrough pain 120 tablet 0  . pantoprazole (PROTONIX) 40 MG tablet Take 1 tablet (40 mg total) by mouth daily. 30 tablet 0  . senna-docusate (SENOKOT-S) 8.6-50 MG tablet Take 2 tablets by mouth 2 (two) times daily. 60 tablet 0  . tiotropium (SPIRIVA) 18 MCG inhalation capsule Place 18 mcg into inhaler and inhale 2 (two) times daily.     No current facility-administered medications for this visit.     SURGICAL HISTORY:  Past Surgical History:  Procedure Laterality Date  . LAMINECTOMY  03/18/2014   T11-L1 laminectomy with right T11-12 nerve root division, and T11-L1 posterior spinal fusion    REVIEW OF SYSTEMS:  Constitutional: positive for fatigue Eyes: negative Ears, nose, mouth, throat, and face: negative Respiratory: negative Cardiovascular: negative Gastrointestinal: negative Genitourinary:negative Integument/breast: negative Hematologic/lymphatic: negative Musculoskeletal:positive for back pain and muscle weakness Neurological: negative Behavioral/Psych: negative Endocrine: negative Allergic/Immunologic: negative   PHYSICAL EXAMINATION: General appearance: alert, cooperative, fatigued and no distress Head: Normocephalic, without obvious abnormality, atraumatic Neck: no adenopathy, no JVD, supple, symmetrical, trachea midline and thyroid not enlarged, symmetric, no tenderness/mass/nodules Lymph nodes: Cervical, supraclavicular, and axillary nodes normal. Resp: clear to auscultation bilaterally Back: symmetric, no curvature. ROM normal. No CVA tenderness. Cardio: regular rate and rhythm, S1, S2 normal, no murmur, click, rub or gallop GI: soft, non-tender; bowel sounds normal; no masses,  no organomegaly Extremities: extremities normal, atraumatic, no cyanosis or edema Neurologic: Alert and oriented X 3, normal strength and tone. Normal symmetric reflexes. Normal coordination and gait  ECOG PERFORMANCE  STATUS: 1 - Symptomatic but completely ambulatory  Blood pressure 97/71, pulse 100, temperature 97.7 F (36.5 C), temperature source Oral, resp. rate 16, height '6\' 8"'$  (2.032 m), weight 188 lb 4.8 oz (85.4 kg), SpO2 100 %.  LABORATORY DATA: Lab Results  Component Value Date   WBC 12.4 (H) 06/05/2016   HGB 12.9 (L) 06/05/2016   HCT 39.1 06/05/2016   MCV 86.1 06/05/2016   PLT 221 06/05/2016      Chemistry      Component Value Date/Time   NA 136 06/05/2016 0427   NA 139 03/18/2016 1019   K 4.2 06/05/2016 0427   K 4.5 03/18/2016 1019   CL 99 (L) 06/05/2016 0427   CO2 30 06/05/2016 0427   CO2 29 03/18/2016 1019   BUN 27 (H) 06/05/2016 0427   BUN 17.0 03/18/2016 1019   CREATININE 0.96 06/05/2016 0427   CREATININE 0.9 03/18/2016 1019      Component Value Date/Time   CALCIUM 10.1 06/05/2016 0427   CALCIUM 10.2 03/18/2016 1019   ALKPHOS 61 06/02/2016 1502   ALKPHOS 73 03/18/2016 1019   AST 24 06/02/2016 1502   AST 25 03/18/2016 1019   ALT 28 06/02/2016 1502   ALT 16 03/18/2016 1019   BILITOT 0.5 06/02/2016 1502   BILITOT 0.38 03/18/2016 1019       RADIOGRAPHIC STUDIES: No results found.  ASSESSMENT AND PLAN: This is a very pleasant 60 years  old African-American male with metastatic non-small cell lung cancer initially diagnosed as a stage IIIB squamous cell carcinoma but the patient has metastatic disease at the T12 status post decompressive laminectomy and the final pathology was consistent with adenocarcinoma with negative EGFR mutation and negative ALK gene translocation. He is currently undergoing palliative radiotherapy again to the T11-L1 metastatic bone lesion under the care of Dr. Tammi Klippel. His last imaging studies showed no evidence for disease progression in any other areas.  I had a lengthy discussion with the patient today about his current condition and treatment options. I gave the patient the option of continuous observation and close monitoring versus  consideration of systemic chemotherapy with carboplatin for AUC of 5, Alimta 500 MG/M2 and Avastin 15 MG/KG every 3 weeks. The patient is interested in proceeding with systemic chemotherapy for concern about further spread of his tumor. I discussed with the patient adverse effects of this treatment including but not limited to alopecia, myelosuppression, nausea and vomiting, peripheral neuropathy, liver or renal dysfunction in addition to the adverse effect of Avastin including pulmonary hemorrhage, GI perforation, wound healing delay as well as hypertension and proteinuria. We will arrange for the patient to receive vitamin B 12 injection today. The patient would also receive prescription for Compazine 10 mg by mouth every 6 hours as needed for nausea, Decadron 4 mg by mouth twice a day, the day before, day of and day after the chemotherapy in addition to folic acid 1 mg by mouth daily. I will arrange for the patient to have a chemotherapy education class before starting the first dose of the chemotherapy. He is expected to start the first dose of this treatment on 08/26/2016. He would come back for follow-up visit in 3 weeks for evaluation and management of any adverse effect of his treatment. For pain management, I strongly encouraged the patient to see a pain clinic specialist, Dr. Lovenia Shuck for evaluation and management of his condition. He knows that I will not be able to refill his narcotic medication unless he sees the pain specialist.  He was advised to call immediately if he has any concerning symptoms in the interval. The patient voices understanding of current disease status and treatment options and is in agreement with the current care plan.  All questions were answered. The patient knows to call the clinic with any problems, questions or concerns. We can certainly see the patient much sooner if necessary.   Disclaimer: This note was dictated with voice recognition software. Similar sounding  words can inadvertently be transcribed and may not be corrected upon review.

## 2016-08-12 NOTE — Progress Notes (Signed)
  Radiation Oncology         [phone]   Name: Herbert Smith MRN: [id number]   Date: 08/12/2016  DOB: 1955-10-01   Weekly Radiation Therapy Management    ICD-9-CM ICD-10-CM   1. 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  Weight and vitals stable. Reports thoracic spine pain 10 on a scale of 0-10. Reports continued weakness of lower extremities. He is able to walk leaned over and grabbing objects to stabilize himself. He is beginning to gain some strength in his legs. Reports taking OxyContin 30 mg every 12 hours and oxycodone 10 mg 1 or 2 tablets every 3-4 hours. Reports taking decadron 2 mg bid as directed. No evidence of thrush noted. Denies incontinence of bowel or bladder. Denies constipation. No edema of bilateral lower extremities noted. Denies nausea or vomiting. Reports he fell without injury on Sunday. One month follow up appointment card given. Seen by Julien Nordmann today and plans to start chemotherapy on 08/26/16. He is staying in [place] right now. He previously attended physical therapy with [facility] in [place]. He will see about restarting this and will contact our office if he needs a referral. He complains of a cough with sputum production while laying down, sometimes this sputum is green or clear in color. This started with radiation. He denies fever or prior instances of pneumonia.   Set-up films were reviewed. The chart was checked.  Physical Findings  weight is 188 lb 4.8 oz (85.4 kg). His blood pressure is 97/84 and his pulse is 67. His respiration is 18 and oxygen saturation is 100%.   Weight essentially stable.  No significant changes. Presents in a wheelchair.   Impression The patient is tolerating radiation.  Plan He has completed radiation treatment today. He will return for follow up in 1 month.  He will contact our office if he needs a  physical therapy referral or an order for a powered wheelchair.  Refill of Oxycontin 30 mg 12 hour written and prescription of Augmentin written today.          Herbert Smith, M.D.  This document serves as a record of services personally performed by Herbert Pita, MD. It was created on his behalf by Arlyce Harman, a trained medical scribe. The creation of this record is based on the scribe's personal observations and the provider's statements to them. This document has been checked and approved by the attending provider.

## 2016-08-12 NOTE — Progress Notes (Signed)
  Radiation Oncology         [phone]) [phone] ________________________________  Name: Herbert Smith MRN: [id number]  Date: 08/12/2016  DOB: Sep 24, 1955  End of Treatment Note  Diagnosis:   60 year-old gentleman with recurrent Metastatic Stage IIIB Squamous cell carcinoma of the lung     Indication for treatment:  Palliative       Radiation treatment dates:   07/30/2016 to 08/12/2016  Site/dose:   The thoracic spine bone metastasis was treated to 30 Gy in 10 fractions at 3 Gy per fraction.   Beams/energy:   IMRT // 6X  Narrative: The patient tolerated radiation treatment relatively well.   He continues to experience thoracic spine pain 10 on a scale of 0-10. He did note beginning improvement of lower extremity strength. He also reports cough with sputum production that is sometimes green in color.   Plan: The patient has completed radiation treatment. The patient will return to radiation oncology clinic for routine followup in one month. Patient was written a prescription for Augmentin and Oxycontin was refilled. I advised him to call or return sooner if he has any questions or concerns related to his recovery or treatment. ________________________________  Sheral Apley. Tammi Klippel, M.D.   This document serves as a record of services personally performed by Tyler Pita, MD. It was created on his behalf by Arlyce Harman, a trained medical scribe. The creation of this record is based on the scribe's personal observations and the provider's statements to them. This document has been checked and approved by the attending provider.

## 2016-08-12 NOTE — Progress Notes (Signed)
Patient on plan of care prior to pathways. 

## 2016-08-12 NOTE — Patient Instructions (Signed)
Steps to Quit Smoking Smoking tobacco can be bad for your health. It can also affect almost every organ in your body. Smoking puts you and people around you at risk for many serious long-lasting (chronic) diseases. Quitting smoking is hard, but it is one of the best things that you can do for your health. It is never too late to quit. What are the benefits of quitting smoking? When you quit smoking, you lower your risk for getting serious diseases and conditions. They can include:  Lung cancer or lung disease.  Heart disease.  Stroke.  Heart attack.  Not being able to have children (infertility).  Weak bones (osteoporosis) and broken bones (fractures). If you have coughing, wheezing, and shortness of breath, those symptoms may get better when you quit. You may also get sick less often. If you are pregnant, quitting smoking can help to lower your chances of having a baby of low birth weight. What can I do to help me quit smoking? Talk with your doctor about what can help you quit smoking. Some things you can do (strategies) include:  Quitting smoking totally, instead of slowly cutting back how much you smoke over a period of time.  Going to in-person counseling. You are more likely to quit if you go to many counseling sessions.  Using resources and support systems, such as:  Online chats with a counselor.  Phone quitlines.  Printed self-help materials.  Support groups or group counseling.  Text messaging programs.  Mobile phone apps or applications.  Taking medicines. Some of these medicines may have nicotine in them. If you are pregnant or breastfeeding, do not take any medicines to quit smoking unless your doctor says it is okay. Talk with your doctor about counseling or other things that can help you. Talk with your doctor about using more than one strategy at the same time, such as taking medicines while you are also going to in-person counseling. This can help make quitting  easier. What things can I do to make it easier to quit? Quitting smoking might feel very hard at first, but there is a lot that you can do to make it easier. Take these steps:  Talk to your family and friends. Ask them to support and encourage you.  Call phone quitlines, reach out to support groups, or work with a counselor.  Ask people who smoke to not smoke around you.  Avoid places that make you want (trigger) to smoke, such as:  Bars.  Parties.  Smoke-break areas at work.  Spend time with people who do not smoke.  Lower the stress in your life. Stress can make you want to smoke. Try these things to help your stress:  Getting regular exercise.  Deep-breathing exercises.  Yoga.  Meditating.  Doing a body scan. To do this, close your eyes, focus on one area of your body at a time from head to toe, and notice which parts of your body are tense. Try to relax the muscles in those areas.  Download or buy apps on your mobile phone or tablet that can help you stick to your quit plan. There are many free apps, such as QuitGuide from the CDC (Centers for Disease Control and Prevention). You can find more support from smokefree.gov and other websites. This information is not intended to replace advice given to you by your health care provider. Make sure you discuss any questions you have with your health care provider. Document Released: 07/04/2009 Document Revised: 05/05/2016 Document   Reviewed: 01/22/2015 Elsevier Interactive Patient Education  2017 [employer] Inc.  

## 2016-08-14 ENCOUNTER — Telehealth: Payer: Self-pay | Admitting: Internal Medicine

## 2016-08-14 NOTE — Telephone Encounter (Signed)
Left message for patient re appointments 12/4 and 12/6. Schedule mailed.

## 2016-08-18 ENCOUNTER — Telehealth: Payer: Self-pay | Admitting: *Deleted

## 2016-08-18 ENCOUNTER — Other Ambulatory Visit: Payer: Self-pay | Admitting: Radiation Oncology

## 2016-08-18 DIAGNOSIS — R262 Difficulty in walking, not elsewhere classified: Secondary | ICD-10-CM

## 2016-08-18 DIAGNOSIS — C7951 Secondary malignant neoplasm of bone: Secondary | ICD-10-CM

## 2016-08-18 NOTE — Addendum Note (Signed)
Encounter addended by: Heywood Footman, RN on: 08/18/2016 11:05 AM<BR>    Actions taken: Outside historical medications filed, Home Medications modified, Visit diagnoses modified, Order Reconciliation Section accessed

## 2016-08-18 NOTE — Telephone Encounter (Signed)
CALLED [facility] TO ARRANGE FOR THIS PATIENT TO GET AN ELECTRIC WHEELCHAIR AND FOR [facility] TO TEST IT TO SEE IF IT IS SAFE FOR THE PATIENT'S HOME, SPOKE WITH SHANNON OF [facility] AND SHE WILL TAKE CARE OF THIS

## 2016-08-20 ENCOUNTER — Ambulatory Visit: Payer: Medicaid Other | Admitting: Internal Medicine

## 2016-08-24 ENCOUNTER — Ambulatory Visit: Payer: Medicaid Other

## 2016-08-24 MED ORDER — PROCHLORPERAZINE MALEATE 10 MG PO TABS: 10.0000 mg | ORAL_TABLET | Freq: Four times a day (QID) | ORAL | 0 refills | Status: DC | PRN

## 2016-08-24 MED ORDER — PROCHLORPERAZINE MALEATE 10 MG PO TABS: 10.0000 mg | ORAL_TABLET | Freq: Four times a day (QID) | ORAL | 0 refills | Status: AC | PRN

## 2016-08-24 MED ORDER — FOLIC ACID 1 MG PO TABS: 1.0000 mg | ORAL_TABLET | Freq: Every day | ORAL | 4 refills | Status: DC

## 2016-08-24 MED ORDER — FOLIC ACID 1 MG PO TABS: 1.0000 mg | ORAL_TABLET | Freq: Every day | ORAL | 4 refills | Status: AC

## 2016-08-26 ENCOUNTER — Other Ambulatory Visit: Payer: Self-pay | Admitting: Internal Medicine

## 2016-08-26 ENCOUNTER — Ambulatory Visit (HOSPITAL_BASED_OUTPATIENT_CLINIC_OR_DEPARTMENT_OTHER): Payer: Medicaid Other

## 2016-08-26 ENCOUNTER — Other Ambulatory Visit (HOSPITAL_BASED_OUTPATIENT_CLINIC_OR_DEPARTMENT_OTHER): Payer: Medicaid Other

## 2016-08-26 VITALS — BP 128/61 | HR 48 | Temp 97.5°F | Resp 17

## 2016-08-26 DIAGNOSIS — C3411 Malignant neoplasm of upper lobe, right bronchus or lung: Secondary | ICD-10-CM | POA: Diagnosis not present

## 2016-08-26 DIAGNOSIS — Z5111 Encounter for antineoplastic chemotherapy: Secondary | ICD-10-CM | POA: Diagnosis not present

## 2016-08-26 DIAGNOSIS — Z5112 Encounter for antineoplastic immunotherapy: Secondary | ICD-10-CM

## 2016-08-26 LAB — COMPREHENSIVE METABOLIC PANEL
ALT: 23 U/L (ref 0–55)
AST: 17 U/L (ref 5–34)
Albumin: 3.1 g/dL — ABNORMAL LOW (ref 3.5–5.0)
Alkaline Phosphatase: 65 U/L (ref 40–150)
Anion Gap: 9 mEq/L (ref 3–11)
BUN: 15.7 mg/dL (ref 7.0–26.0)
CALCIUM: 10 mg/dL (ref 8.4–10.4)
CHLORIDE: 103 meq/L (ref 98–109)
CO2: 27 mEq/L (ref 22–29)
Creatinine: 0.7 mg/dL (ref 0.7–1.3)
EGFR: >90 mL/min/{1.73_m2} (ref >90)
GLUCOSE: 67 mg/dL — AB (ref 70–140)
POTASSIUM: 3.6 meq/L (ref 3.5–5.1)
SODIUM: 139 meq/L (ref 136–145)
Total Bilirubin: 0.23 mg/dL (ref 0.20–1.20)
Total Protein: 6.7 g/dL (ref 6.4–8.3)

## 2016-08-26 LAB — CBC WITH DIFFERENTIAL/PLATELET
BASO%: 0.1 % (ref 0.0–2.0)
Basophils Absolute: 0 10*3/uL (ref 0.0–0.1)
EOS%: 0.1 % (ref 0.0–7.0)
Eosinophils Absolute: 0 10*3/uL (ref 0.0–0.5)
HCT: 41.2 % (ref 38.4–49.9)
HGB: 13.5 g/dL (ref 13.0–17.1)
LYMPH%: 12.9 % — AB (ref 14.0–49.0)
MCH: 28.8 pg (ref 27.2–33.4)
MCHC: 32.8 g/dL (ref 32.0–36.0)
MCV: 87.8 fL (ref 79.3–98.0)
MONO#: 0.8 10*3/uL (ref 0.1–0.9)
MONO%: 11.7 % (ref 0.0–14.0)
NEUT#: 5.3 10*3/uL (ref 1.5–6.5)
NEUT%: 75.2 % — AB (ref 39.0–75.0)
Platelets: 201 10*3/uL (ref 140–400)
RBC: 4.69 10*6/uL (ref 4.20–5.82)
RDW: 16.9 % — ABNORMAL HIGH (ref 11.0–14.6)
WBC: 7 10*3/uL (ref 4.0–10.3)
lymph#: 0.9 10*3/uL (ref 0.9–3.3)
nRBC: 0 % (ref 0–0)

## 2016-08-26 LAB — UA PROTEIN, DIPSTICK - CHCC: PROTEIN: NEGATIVE mg/dL

## 2016-08-26 MED ORDER — PALONOSETRON HCL INJECTION 0.25 MG/5ML
0.2500 mg | Freq: Once | INTRAVENOUS | Status: AC
  Administered 2016-08-26: 0.25 mg via INTRAVENOUS

## 2016-08-26 MED ORDER — SODIUM CHLORIDE 0.9 % IV SOLN
Freq: Once | INTRAVENOUS | Status: AC
  Administered 2016-08-26: 13:00:00 via INTRAVENOUS
  Filled 2016-08-26: qty 5

## 2016-08-26 MED ORDER — SODIUM CHLORIDE 0.9 % IV SOLN
Freq: Once | INTRAVENOUS | Status: AC
  Administered 2016-08-26: 11:00:00 via INTRAVENOUS

## 2016-08-26 MED ORDER — SODIUM CHLORIDE 0.9 % IV SOLN
710.0000 mg | Freq: Once | INTRAVENOUS | Status: AC
  Administered 2016-08-26: 710 mg via INTRAVENOUS
  Filled 2016-08-26: qty 71

## 2016-08-26 MED ORDER — SODIUM CHLORIDE 0.9 % IV SOLN
15.2500 mg/kg | Freq: Once | INTRAVENOUS | Status: AC
  Administered 2016-08-26: 1300 mg via INTRAVENOUS
  Filled 2016-08-26: qty 48

## 2016-08-26 MED ORDER — PALONOSETRON HCL INJECTION 0.25 MG/5ML
INTRAVENOUS | Status: AC
  Filled 2016-08-26: qty 5

## 2016-08-26 MED ORDER — SODIUM CHLORIDE 0.9 % IV SOLN
500.0000 mg/m2 | Freq: Once | INTRAVENOUS | Status: AC
  Administered 2016-08-26: 1100 mg via INTRAVENOUS
  Filled 2016-08-26: qty 40

## 2016-08-26 NOTE — Progress Notes (Signed)
Pt receiving first-time avastin, alimta, carboplatin today. Chemotherapy consent sheet signed. Discussed general flow of infusion room with pt and fiance. In-basket placed for f/u phone call tomorrow. Educated pt on potential side effects and treatment or who to call with specific symptoms. Pt verbalized understanding. AVS printed and discussed what medications to take when based on oncologist's orders. Pt and fiance verbalized understanding. Pt also given a Chemo Patient Card to present in the ED in the event of admission. Answered all questions of pt and fiance.

## 2016-08-26 NOTE — Patient Instructions (Signed)
[facility] Discharge Instructions for Patients Receiving Chemotherapy  Today you received the following chemotherapy agents bevacizumab (Avastin), pemetrexed (Alimta), Carboplatin.  To help prevent nausea and vomiting after your treatment, we encourage you to take your nausea medication as directed by your MD.   If you develop nausea and vomiting that is not controlled by your nausea medication, call the clinic.   BELOW ARE SYMPTOMS THAT SHOULD BE REPORTED IMMEDIATELY:  *FEVER GREATER THAN 100.5 F  *CHILLS WITH OR WITHOUT FEVER  NAUSEA AND VOMITING THAT IS NOT CONTROLLED WITH YOUR NAUSEA MEDICATION  *UNUSUAL SHORTNESS OF BREATH  *UNUSUAL BRUISING OR BLEEDING  TENDERNESS IN MOUTH AND THROAT WITH OR WITHOUT PRESENCE OF ULCERS  *URINARY PROBLEMS  *BOWEL PROBLEMS  UNUSUAL RASH Items with * indicate a potential emergency and should be followed up as soon as possible.  Feel free to call the clinic you have any questions or concerns. The clinic phone number is [phone].  Please show the [facility] at check-in to the Emergency Department and triage nurse.  Bevacizumab injection What is this medicine? BEVACIZUMAB (be va SIZ yoo mab) is a monoclonal antibody. It is used to treat cervical cancer, colorectal cancer, glioblastoma multiforme, non-small cell lung cancer (NSCLC), ovarian cancer, and renal cell cancer. This medicine may be used for other purposes; ask your health care provider or pharmacist if you have questions. COMMON BRAND NAME(S): Avastin What should I tell my health care provider before I take this medicine? They need to know if you have any of these conditions: -blood clots -heart disease, including heart failure, heart attack, or chest pain (angina) -high blood pressure -infection (especially a virus infection such as chickenpox, cold sores, or herpes) -kidney disease -lung disease -prior chemotherapy with doxorubicin, daunorubicin,  epirubicin, or other anthracycline type chemotherapy agents -recent or ongoing radiation therapy -recent surgery -stroke -an unusual or allergic reaction to bevacizumab, hamster proteins, mouse proteins, other medicines, foods, dyes, or preservatives -pregnant or trying to get pregnant -breast-feeding How should I use this medicine? This medicine is for infusion into a vein. It is given by a health care professional in a hospital or clinic setting. Talk to your pediatrician regarding the use of this medicine in children. Special care may be needed. Overdosage: If you think you have taken too much of this medicine contact a poison control center or emergency room at once. NOTE: This medicine is only for you. Do not share this medicine with others. What if I miss a dose? It is important not to miss your dose. Call your doctor or health care professional if you are unable to keep an appointment. What may interact with this medicine? Interactions are not expected. This list may not describe all possible interactions. Give your health care provider a list of all the medicines, herbs, non-prescription drugs, or dietary supplements you use. Also tell them if you smoke, drink alcohol, or use illegal drugs. Some items may interact with your medicine. What should I watch for while using this medicine? Your condition will be monitored carefully while you are receiving this medicine. You will need important blood work and urine testing done while you are taking this medicine. During your treatment, let your health care professional know if you have any unusual symptoms, such as difficulty breathing. This medicine may rarely cause 'gastrointestinal perforation' (holes in the stomach, intestines or colon), a serious side effect requiring surgery to repair. This medicine should be started at least 28 days following major  surgery and the site of the surgery should be totally healed. Check with your doctor  before scheduling dental work or surgery while you are receiving this treatment. Talk to your doctor if you have recently had surgery or if you have a wound that has not healed. Do not become pregnant while taking this medicine or for 6 months after stopping it. Women should inform their doctor if they wish to become pregnant or think they might be pregnant. There is a potential for serious side effects to an unborn child. Talk to your health care professional or pharmacist for more information. Do not breast-feed an infant while taking this medicine. This medicine has caused ovarian failure in some women. This medicine may interfere with the ability to have a child. You should talk to your doctor or health care professional if you are concerned about your fertility. What side effects may I notice from receiving this medicine? Side effects that you should report to your doctor or health care professional as soon as possible: -allergic reactions like skin rash, itching or hives, swelling of the face, lips, or tongue -breathing problems -changes in vision -chest pain -confusion -jaw pain, especially after dental work -mouth sores -seizures -severe abdominal pain -severe headache -signs of decreased platelets or bleeding - bruising, pinpoint red spots on the skin, black, tarry stools, nosebleeds, blood in the urine -signs of infection - fever or chills, cough, sore throat, pain or trouble passing urine -sudden numbness or weakness of the face, arm or leg -swelling of legs or ankles -symptoms of a stroke: change in mental awareness, inability to talk or move one side of the body (especially in patients with lung cancer) -trouble passing urine or change in the amount of urine -trouble speaking or understanding -trouble walking, dizziness, loss of balance or coordination Side effects that usually do not require medical attention (report to your doctor or health care professional if they continue or  are bothersome): -constipation -diarrhea -dry skin -headache -loss of appetite -nausea, vomiting This list may not describe all possible side effects. Call your doctor for medical advice about side effects. You may report side effects to FDA at 1-800-FDA-1088. Where should I keep my medicine? This drug is given in a hospital or clinic and will not be stored at home. NOTE: This sheet is a summary. It may not cover all possible information. If you have questions about this medicine, talk to your doctor, pharmacist, or health care provider.  2017 [employer]vier/Gold Standard (2015-08-30 15:28:53)  Pemetrexed injection What is this medicine? PEMETREXED (PEM e TREX ed) is a chemotherapy drug. This medicine affects cells that are rapidly growing, such as cancer cells and cells in your mouth and stomach. It is usually used to treat lung cancers like non-small cell lung cancer and mesothelioma. It may also be used to treat other cancers. This medicine may be used for other purposes; ask your health care provider or pharmacist if you have questions. COMMON BRAND NAME(S): Alimta What should I tell my health care provider before I take this medicine? They need to know if you have any of these conditions: -if you frequently drink alcohol containing beverages -infection (especially a virus infection such as chickenpox, cold sores, or herpes) -kidney disease -liver disease -low blood counts, like low platelets, red bloods, or white blood cells -an unusual or allergic reaction to pemetrexed, mannitol, other medicines, foods, dyes, or preservatives -pregnant or trying to get pregnant -breast-feeding How should I use this medicine? This drug is  given as an infusion into a vein. It is administered in a hospital or clinic by a specially trained health care professional. Talk to your pediatrician regarding the use of this medicine in children. Special care may be needed. Overdosage: If you think you have taken  too much of this medicine contact a poison control center or emergency room at once. NOTE: This medicine is only for you. Do not share this medicine with others. What if I miss a dose? It is important not to miss your dose. Call your doctor or health care professional if you are unable to keep an appointment. What may interact with this medicine? -aspirin and aspirin-like medicines -medicines to increase blood counts like filgrastim, pegfilgrastim, sargramostim -methotrexate -NSAIDS, medicines for pain and inflammation, like ibuprofen or naproxen -probenecid -pyrimethamine -vaccines Talk to your doctor or health care professional before taking any of these medicines: -acetaminophen -aspirin -ibuprofen -ketoprofen -naproxen This list may not describe all possible interactions. Give your health care provider a list of all the medicines, herbs, non-prescription drugs, or dietary supplements you use. Also tell them if you smoke, drink alcohol, or use illegal drugs. Some items may interact with your medicine. What should I watch for while using this medicine? Visit your doctor for checks on your progress. This drug may make you feel generally unwell. This is not uncommon, as chemotherapy can affect healthy cells as well as cancer cells. Report any side effects. Continue your course of treatment even though you feel ill unless your doctor tells you to stop. In some cases, you may be given additional medicines to help with side effects. Follow all directions for their use. Call your doctor or health care professional for advice if you get a fever, chills or sore throat, or other symptoms of a cold or flu. Do not treat yourself. This drug decreases your body's ability to fight infections. Try to avoid being around people who are sick. This medicine may increase your risk to bruise or bleed. Call your doctor or health care professional if you notice any unusual bleeding. Be careful brushing and  flossing your teeth or using a toothpick because you may get an infection or bleed more easily. If you have any dental work done, tell your dentist you are receiving this medicine. Avoid taking products that contain aspirin, acetaminophen, ibuprofen, naproxen, or ketoprofen unless instructed by your doctor. These medicines may hide a fever. Call your doctor or health care professional if you get diarrhea or mouth sores. Do not treat yourself. To protect your kidneys, drink water or other fluids as directed while you are taking this medicine. Men and women must use effective birth control while taking this medicine. You may also need to continue using effective birth control for a time after stopping this medicine. Do not become pregnant while taking this medicine. Tell your doctor right away if you think that you or your partner might be pregnant. There is a potential for serious side effects to an unborn child. Talk to your health care professional or pharmacist for more information. Do not breast-feed an infant while taking this medicine. This medicine may lower sperm counts. What side effects may I notice from receiving this medicine? Side effects that you should report to your doctor or health care professional as soon as possible: -allergic reactions like skin rash, itching or hives, swelling of the face, lips, or tongue -low blood counts - this medicine may decrease the number of white blood cells, red blood cells and  platelets. You may be at increased risk for infections and bleeding. -signs of infection - fever or chills, cough, sore throat, pain or difficulty passing urine -signs of decreased platelets or bleeding - bruising, pinpoint red spots on the skin, black, tarry stools, blood in the urine -signs of decreased red blood cells - unusually weak or tired, fainting spells, lightheadedness -breathing problems, like a dry cough -changes in emotions or moods -chest  pain -confusion -diarrhea -high blood pressure -mouth or throat sores or ulcers -pain, swelling, warmth in the leg -pain on swallowing -swelling of the ankles, feet, hands -trouble passing urine or change in the amount of urine -vomiting -yellowing of the eyes or skin Side effects that usually do not require medical attention (report to your doctor or health care professional if they continue or are bothersome): -hair loss -loss of appetite -nausea -stomach upset This list may not describe all possible side effects. Call your doctor for medical advice about side effects. You may report side effects to FDA at 1-800-FDA-1088. Where should I keep my medicine? This drug is given in a hospital or clinic and will not be stored at home. NOTE: This sheet is a summary. It may not cover all possible information. If you have questions about this medicine, talk to your doctor, pharmacist, or health care provider.  2017 Elsevier/Gold Standard (2008-04-10 13:24:03)  Carboplatin injection What is this medicine? CARBOPLATIN (KAR boe pla tin) is a chemotherapy drug. It targets fast dividing cells, like cancer cells, and causes these cells to die. This medicine is used to treat ovarian cancer and many other cancers. This medicine may be used for other purposes; ask your health care provider or pharmacist if you have questions. COMMON BRAND NAME(S): Paraplatin What should I tell my health care provider before I take this medicine? They need to know if you have any of these conditions: -blood disorders -hearing problems -kidney disease -recent or ongoing radiation therapy -an unusual or allergic reaction to carboplatin, cisplatin, other chemotherapy, other medicines, foods, dyes, or preservatives -pregnant or trying to get pregnant -breast-feeding How should I use this medicine? This drug is usually given as an infusion into a vein. It is administered in a hospital or clinic by a specially trained  health care professional. Talk to your pediatrician regarding the use of this medicine in children. Special care may be needed. Overdosage: If you think you have taken too much of this medicine contact a poison control center or emergency room at once. NOTE: This medicine is only for you. Do not share this medicine with others. What if I miss a dose? It is important not to miss a dose. Call your doctor or health care professional if you are unable to keep an appointment. What may interact with this medicine? -medicines for seizures -medicines to increase blood counts like filgrastim, pegfilgrastim, sargramostim -some antibiotics like amikacin, gentamicin, neomycin, streptomycin, tobramycin -vaccines Talk to your doctor or health care professional before taking any of these medicines: -acetaminophen -aspirin -ibuprofen -ketoprofen -naproxen This list may not describe all possible interactions. Give your health care provider a list of all the medicines, herbs, non-prescription drugs, or dietary supplements you use. Also tell them if you smoke, drink alcohol, or use illegal drugs. Some items may interact with your medicine. What should I watch for while using this medicine? Your condition will be monitored carefully while you are receiving this medicine. You will need important blood work done while you are taking this medicine. This drug may  make you feel generally unwell. This is not uncommon, as chemotherapy can affect healthy cells as well as cancer cells. Report any side effects. Continue your course of treatment even though you feel ill unless your doctor tells you to stop. In some cases, you may be given additional medicines to help with side effects. Follow all directions for their use. Call your doctor or health care professional for advice if you get a fever, chills or sore throat, or other symptoms of a cold or flu. Do not treat yourself. This drug decreases your body's ability to fight  infections. Try to avoid being around people who are sick. This medicine may increase your risk to bruise or bleed. Call your doctor or health care professional if you notice any unusual bleeding. Be careful brushing and flossing your teeth or using a toothpick because you may get an infection or bleed more easily. If you have any dental work done, tell your dentist you are receiving this medicine. Avoid taking products that contain aspirin, acetaminophen, ibuprofen, naproxen, or ketoprofen unless instructed by your doctor. These medicines may hide a fever. Do not become pregnant while taking this medicine. Women should inform their doctor if they wish to become pregnant or think they might be pregnant. There is a potential for serious side effects to an unborn child. Talk to your health care professional or pharmacist for more information. Do not breast-feed an infant while taking this medicine. What side effects may I notice from receiving this medicine? Side effects that you should report to your doctor or health care professional as soon as possible: -allergic reactions like skin rash, itching or hives, swelling of the face, lips, or tongue -signs of infection - fever or chills, cough, sore throat, pain or difficulty passing urine -signs of decreased platelets or bleeding - bruising, pinpoint red spots on the skin, black, tarry stools, nosebleeds -signs of decreased red blood cells - unusually weak or tired, fainting spells, lightheadedness -breathing problems -changes in hearing -changes in vision -chest pain -high blood pressure -low blood counts - This drug may decrease the number of white blood cells, red blood cells and platelets. You may be at increased risk for infections and bleeding. -nausea and vomiting -pain, swelling, redness or irritation at the injection site -pain, tingling, numbness in the hands or feet -problems with balance, talking, walking -trouble passing urine or change  in the amount of urine Side effects that usually do not require medical attention (report to your doctor or health care professional if they continue or are bothersome): -hair loss -loss of appetite -metallic taste in the mouth or changes in taste This list may not describe all possible side effects. Call your doctor for medical advice about side effects. You may report side effects to FDA at 1-800-FDA-1088. Where should I keep my medicine? This drug is given in a hospital or clinic and will not be stored at home. NOTE: This sheet is a summary. It may not cover all possible information. If you have questions about this medicine, talk to your doctor, pharmacist, or health care provider.  2017 [employer]/Gold Standard (2007-12-13 14:38:05)

## 2016-09-01 ENCOUNTER — Other Ambulatory Visit: Payer: Medicaid Other

## 2016-09-01 ENCOUNTER — Ambulatory Visit: Payer: Medicaid Other | Admitting: Internal Medicine

## 2016-09-01 ENCOUNTER — Telehealth: Payer: Self-pay

## 2016-09-01 NOTE — Telephone Encounter (Signed)
Pt is calling to r/s his missed appt from today. Lab and MD. inbasket sent to schedulers with "routine" priority

## 2016-09-02 ENCOUNTER — Other Ambulatory Visit: Payer: Medicaid Other

## 2016-09-04 ENCOUNTER — Telehealth: Payer: Self-pay | Admitting: Internal Medicine

## 2016-09-04 NOTE — Telephone Encounter (Signed)
lvm to inform pt of r/s office visit to 12/20 per LOS

## 2016-09-09 ENCOUNTER — Other Ambulatory Visit: Payer: Self-pay | Admitting: Radiation Oncology

## 2016-09-09 ENCOUNTER — Other Ambulatory Visit (HOSPITAL_BASED_OUTPATIENT_CLINIC_OR_DEPARTMENT_OTHER): Payer: Medicaid Other

## 2016-09-09 ENCOUNTER — Ambulatory Visit (HOSPITAL_BASED_OUTPATIENT_CLINIC_OR_DEPARTMENT_OTHER): Payer: Medicaid Other | Admitting: Internal Medicine

## 2016-09-09 ENCOUNTER — Encounter: Payer: Self-pay | Admitting: Internal Medicine

## 2016-09-09 VITALS — BP 111/64 | HR 76 | Temp 98.2°F | Resp 18 | Ht >= 80 in | Wt 197.4 lb

## 2016-09-09 DIAGNOSIS — M545 Low back pain: Secondary | ICD-10-CM | POA: Diagnosis not present

## 2016-09-09 DIAGNOSIS — C3411 Malignant neoplasm of upper lobe, right bronchus or lung: Secondary | ICD-10-CM | POA: Diagnosis not present

## 2016-09-09 DIAGNOSIS — M5441 Lumbago with sciatica, right side: Secondary | ICD-10-CM

## 2016-09-09 DIAGNOSIS — C7951 Secondary malignant neoplasm of bone: Secondary | ICD-10-CM | POA: Diagnosis not present

## 2016-09-09 DIAGNOSIS — G893 Neoplasm related pain (acute) (chronic): Secondary | ICD-10-CM

## 2016-09-09 DIAGNOSIS — G8929 Other chronic pain: Secondary | ICD-10-CM

## 2016-09-09 DIAGNOSIS — Z7189 Other specified counseling: Secondary | ICD-10-CM | POA: Insufficient documentation

## 2016-09-09 LAB — COMPREHENSIVE METABOLIC PANEL
ALBUMIN: 3.2 g/dL — AB (ref 3.5–5.0)
ALK PHOS: 68 U/L (ref 40–150)
ALT: 53 U/L (ref 0–55)
ANION GAP: 7 meq/L (ref 3–11)
AST: 35 U/L — ABNORMAL HIGH (ref 5–34)
BILIRUBIN TOTAL: 0.27 mg/dL (ref 0.20–1.20)
BUN: 18.6 mg/dL (ref 7.0–26.0)
CO2: 28 mEq/L (ref 22–29)
CREATININE: 0.7 mg/dL (ref 0.7–1.3)
Calcium: 10 mg/dL (ref 8.4–10.4)
Chloride: 103 mEq/L (ref 98–109)
GLUCOSE: 86 mg/dL (ref 70–140)
Potassium: 4 mEq/L (ref 3.5–5.1)
Sodium: 138 mEq/L (ref 136–145)
TOTAL PROTEIN: 6.6 g/dL (ref 6.4–8.3)

## 2016-09-09 LAB — CBC WITH DIFFERENTIAL/PLATELET
BASO%: 0.2 % (ref 0.0–2.0)
Basophils Absolute: 0 10*3/uL (ref 0.0–0.1)
EOS%: 1.5 % (ref 0.0–7.0)
Eosinophils Absolute: 0 10*3/uL (ref 0.0–0.5)
HCT: 40.3 % (ref 38.4–49.9)
HGB: 12.9 g/dL — ABNORMAL LOW (ref 13.0–17.1)
LYMPH%: 24.4 % (ref 14.0–49.0)
MCH: 28.6 pg (ref 27.2–33.4)
MCHC: 32 g/dL (ref 32.0–36.0)
MCV: 89.5 fL (ref 79.3–98.0)
MONO#: 0.3 10*3/uL (ref 0.1–0.9)
MONO%: 11.5 % (ref 0.0–14.0)
NEUT#: 1.5 10*3/uL (ref 1.5–6.5)
NEUT%: 62.4 % (ref 39.0–75.0)
Platelets: 117 10*3/uL — ABNORMAL LOW (ref 140–400)
RBC: 4.5 10*6/uL (ref 4.20–5.82)
RDW: 16.6 % — ABNORMAL HIGH (ref 11.0–14.6)
WBC: 2.4 10*3/uL — ABNORMAL LOW (ref 4.0–10.3)
lymph#: 0.6 10*3/uL — ABNORMAL LOW (ref 0.9–3.3)

## 2016-09-09 MED ORDER — OXYCODONE HCL 10 MG PO TABS: 10.0000 mg | ORAL_TABLET | ORAL | 0 refills | Status: AC | PRN

## 2016-09-09 MED ORDER — OXYCODONE HCL ER 30 MG PO T12A: 30.0000 mg | EXTENDED_RELEASE_TABLET | Freq: Two times a day (BID) | ORAL | 0 refills | Status: AC

## 2016-09-09 NOTE — Progress Notes (Signed)
[place] [phone]   [phone]  OFFICE PROGRESS NOTE  OSEI-BONSU,GEORGE, MD [address] [place]  [address]  DIAGNOSIS: Metastatic non-small cell lung cancer, squamous cell carcinoma initially diagnosed as a stage IIIB in [place] in March 2011. Recent biopsy from the T11 decompressive laminectomy was consistent with adenocarcinoma.  PRIOR THERAPY: 1) status post induction chemotherapy with carboplatin and paclitaxel followed by a course of concurrent chemoradiation with carboplatin and paclitaxel completed in July 2013 with stable disease in the chest. This was under the care of Dr. Bradly Chris in [place]. 2) status post decompressive T11-L1 laminectomies, right T11-12 nerve root division and T11-L1 posterior spinal fusion on 03/18/2014. 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 The tumor was negative for EGFR mutation and negative for ALK gene translocation. 3) status post CyberKnife radiosurgery to the vertebral body tumor in early August 2015. 4) palliative radiotherapy again to the metastatic lesions at T11-L1 under the care of Dr. Tammi Klippel. Last fraction of treatment today.  CURRENT THERAPY: Systemic chemotherapy was carboplatin for AUC of 5, Alimta 500 MG/M2 and Avastin 15 MG/KG every 3 weeks. Status post one cycle. First dose was given 08/26/2016.  INTERVAL HISTORY: Herbert Smith 60 y.o. male returns to the clinic today for follow-up visit accompanied by his sister. The patient currently lives in [place] with his sister. There are a lot of logistic issues to bring him to [facility] for treatment. He is interested in switching his care to medical oncology in [facility] and he gave me the name of Dr. Burnis Medin for the referral. He has rough time with the first cycle of his systemic chemotherapy. He  complained of increasing fatigue and weakness as well as nausea but no vomiting. He continues to complain of the low back pain and he is currently on pain medication prescribed by Dr. Tammi Klippel. He is supposed to have a visit at the pain clinic on 10/06/2016. He denied having any current chest pain, shortness of breath, cough or hemoptysis. He has no fever or chills. He gained around 10 pounds since his last visit. He is here today for reevaluation and discussion of his condition.   MEDICAL HISTORY: Past Medical History:  Diagnosis Date  . Bone cancer ([place])    spine  . Cancer ([place])    lung ca dx'd 2011  . Encounter for antineoplastic chemotherapy 08/12/2016  . Primary cancer of right upper lobe of lung ([place]) 09/12/2014    ALLERGIES:  is allergic to fentanyl.  MEDICATIONS:  Current Outpatient Prescriptions  Medication Sig Dispense Refill  . acetaminophen (TYLENOL) 325 MG tablet Take 2 tablets (650 mg total) by mouth every 6 (six) hours as needed for mild pain (or Fever >/= 101). 60 tablet 0  . albuterol (PROVENTIL HFA;VENTOLIN HFA) 108 (90 BASE) MCG/ACT inhaler Inhale 2 puffs into the lungs every 6 (six) hours as needed for wheezing or shortness of breath.    Marland Kitchen albuterol (PROVENTIL) (2.5 MG/3ML) 0.083% nebulizer solution Take 2.5 mg by nebulization every 6 (six) hours as needed for wheezing or shortness of breath.    Marland Kitchen amoxicillin-clavulanate (AUGMENTIN) 875-125 MG tablet TK 1 T PO Q 12 H FOR 7 DAYS disp: 14 No refills.  0  . bisacodyl (DULCOLAX) 5 MG EC tablet Take 1 tablet (5 mg total) by mouth daily as needed for moderate constipation. 30 tablet 1  . castor oil  liquid Take 5 mLs by mouth daily as needed for moderate constipation.    . cyclobenzaprine (FLEXERIL) 5 MG tablet Take 1 tablet (5 mg total) by mouth 3 (three) times daily as needed for muscle spasms. 30 tablet 0  . dexamethasone (DECADRON) 4 MG tablet Take 0.5 tablets (2 mg total) by mouth 2 (two) times daily. 60 tablet 1  .  diphenhydrAMINE (BENADRYL) 25 MG tablet Take 50 mg by mouth at bedtime as needed for sleep.    . feeding supplement, ENSURE ENLIVE, (ENSURE ENLIVE) LIQD Take 237 mLs by mouth 3 (three) times daily between meals. (Patient not taking: Reported on 08/12/2016) 001 mL 12  . folic acid (FOLVITE) 1 MG tablet Take 1 tablet (1 mg total) by mouth daily. 30 tablet 4  . gabapentin (NEURONTIN) 300 MG capsule Take 1 capsule (300 mg total) by mouth 3 (three) times daily as needed. 90 capsule 3  . oxyCODONE (OXYCONTIN) 30 MG 12 hr tablet Take 30 mg by mouth 2 (two) times daily. 60 each 0  . Oxycodone HCl 10 MG TABS Take 1-2 tablets (10-20 mg total) by mouth every 4 (four) hours as needed. Severe or breakthrough pain 120 tablet 0  . pantoprazole (PROTONIX) 40 MG tablet Take 1 tablet (40 mg total) by mouth daily. 30 tablet 0  . prochlorperazine (COMPAZINE) 10 MG tablet Take 1 tablet (10 mg total) by mouth every 6 (six) hours as needed for nausea or vomiting. 30 tablet 0  . senna-docusate (SENOKOT-S) 8.6-50 MG tablet Take 2 tablets by mouth 2 (two) times daily. 60 tablet 0  . tiotropium (SPIRIVA) 18 MCG inhalation capsule Place 18 mcg into inhaler and inhale 2 (two) times daily.     No current facility-administered medications for this visit.     SURGICAL HISTORY:  Past Surgical History:  Procedure Laterality Date  . LAMINECTOMY  03/18/2014   T11-L1 laminectomy with right T11-12 nerve root division, and T11-L1 posterior spinal fusion    REVIEW OF SYSTEMS:  Constitutional: positive for fatigue Eyes: negative Ears, nose, mouth, throat, and face: negative Respiratory: negative Cardiovascular: negative Gastrointestinal: positive for nausea Genitourinary:negative Integument/breast: negative Hematologic/lymphatic: negative Musculoskeletal:positive for bone pain Neurological: negative Behavioral/Psych: negative Endocrine: negative Allergic/Immunologic: negative   PHYSICAL EXAMINATION: General appearance:  alert, cooperative, fatigued and no distress Head: Normocephalic, without obvious abnormality, atraumatic Neck: no adenopathy, no JVD, supple, symmetrical, trachea midline and thyroid not enlarged, symmetric, no tenderness/mass/nodules Lymph nodes: Cervical, supraclavicular, and axillary nodes normal. Resp: clear to auscultation bilaterally Back: Mild tenderness in the lower back. Cardio: regular rate and rhythm, S1, S2 normal, no murmur, click, rub or gallop GI: soft, non-tender; bowel sounds normal; no masses,  no organomegaly Extremities: extremities normal, atraumatic, no cyanosis or edema Neurologic: Alert and oriented X 3, normal strength and tone. Normal symmetric reflexes. Normal coordination and gait  ECOG PERFORMANCE STATUS: 2 - Symptomatic, <50% confined to bed  Blood pressure 111/64, pulse 76, temperature 98.2 F (36.8 C), temperature source Oral, resp. rate 18, height '6\' 8"'$  (2.032 m), weight 197 lb 6.4 oz (89.5 kg), SpO2 97 %.  LABORATORY DATA: Lab Results  Component Value Date   WBC 2.4 (L) 09/09/2016   HGB 12.9 (L) 09/09/2016   HCT 40.3 09/09/2016   MCV 89.5 09/09/2016   PLT 117 (L) 09/09/2016      Chemistry      Component Value Date/Time   NA 138 09/09/2016 1054   K 4.0 09/09/2016 1054   CL 99 (L) 06/05/2016 0427  CO2 28 09/09/2016 1054   BUN 18.6 09/09/2016 1054   CREATININE 0.7 09/09/2016 1054      Component Value Date/Time   CALCIUM 10.0 09/09/2016 1054   ALKPHOS 68 09/09/2016 1054   AST 35 (H) 09/09/2016 1054   ALT 53 09/09/2016 1054   BILITOT 0.27 09/09/2016 1054       RADIOGRAPHIC STUDIES: No results found.  ASSESSMENT AND PLAN: This is a very pleasant 60 years old African-American male with: 1) metastatic non-small cell lung cancer that was initially diagnosed as a stage IIIB squamous cell carcinoma in [place], status post concurrent chemoradiation. Then the patient developed metastatic disease at T10 status post laminectomy with  palliative radiotherapy. He has been observation for several years but recently started having increasing pain at the previous site of laminectomy. He underwent stereotactic radiotherapy as well as surgical resection in [place]. Repeat biopsy from DT 10 lesion was consistent with adenocarcinoma with negative EGFR mutation and no other actionable mutations. PDL 1 expression was negative. He was started recently on systemic chemotherapy with carboplatin, Alimta and Avastin status post 1 cycle. He did not like his current systemic chemotherapy and complained of increasing fatigue and nausea and vomiting.Marland Kitchen He is not interested in continuing with the same regimen. The patient also lives with his sister in [place] and he would like referral to Dr. Burnis Medin a medical oncologist in [place] for convenience. I agreed with the patient. I made the referral to Dr. Renard Hamper. I canceled old his chemotherapy, lab and follow-up visit in [place]. 2) pain management: He was referred to the pain clinic. He is currently on pain medication prescribed by Dr. Tammi Klippel. 3) chemotherapy-induced anemia and thrombocytopenia: Stable. We'll continue to monitor. The patient was advised to call if he has any concerning complaints until he sees Dr. Renard Hamper. The patient voices understanding of current disease status and treatment options and is in agreement with the current care plan.  All questions were answered. The patient knows to call the clinic with any problems, questions or concerns. We can certainly see the patient much sooner if necessary.  Disclaimer: This note was dictated with voice recognition software. Similar sounding words can inadvertently be transcribed and may not be corrected upon review.

## 2016-09-11 ENCOUNTER — Encounter: Payer: Self-pay | Admitting: Radiation Oncology

## 2016-09-11 NOTE — Progress Notes (Signed)
Provided patient with script for oxycodone and oxycontin. Explained that in the future he will need to obtain pain medication refills from Dr. Clydell Hakim. Patient verbalized he is scheduled to see Dr. Maryjean Ka on 10/06/2016. Patient verbalized understanding.

## 2016-09-16 ENCOUNTER — Ambulatory Visit: Payer: Medicaid Other

## 2016-09-16 ENCOUNTER — Telehealth: Payer: Self-pay | Admitting: Internal Medicine

## 2016-09-16 ENCOUNTER — Ambulatory Visit: Payer: Medicaid Other | Admitting: Internal Medicine

## 2016-09-16 ENCOUNTER — Other Ambulatory Visit: Payer: Medicaid Other

## 2016-09-16 NOTE — Telephone Encounter (Signed)
Faxed records to [facility] ([id number] release id)

## 2016-09-18 ENCOUNTER — Other Ambulatory Visit: Payer: Medicaid Other

## 2016-09-23 ENCOUNTER — Other Ambulatory Visit: Payer: Medicaid Other

## 2016-09-23 ENCOUNTER — Ambulatory Visit: Payer: Medicaid Other | Admitting: Internal Medicine

## 2016-09-23 NOTE — Progress Notes (Addendum)
Herbert Smith    is here for a one month follow up appointment for recurrent Metastatic Stage IIIB Squamous cell carcinoma of the lung     Weight changes, if any: Respiratory complaints, if any:  Hemoptysis, if any:   Swallowing Problems/Pain/Difficulty swallowing: Smoking Tobacco/Marijuana/Snuff/ETOH use: Appetite : Pain:  When is next chemo scheduled?: Lab work from of chart:

## 2016-09-28 ENCOUNTER — Telehealth: Payer: Self-pay | Admitting: *Deleted

## 2016-09-28 NOTE — Telephone Encounter (Signed)
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  Received referral for this patient who's here today.  We need pathology report faxed to [phone]."  Faxed pathology from 2014.

## 2016-09-29 ENCOUNTER — Ambulatory Visit
Admission: RE | Admit: 2016-09-29 | Discharge: 2016-09-29 | Disposition: A | Discharge status: Home / Self Care | Payer: Medicaid Other | Source: Ambulatory Visit | Attending: Radiation Oncology | Admitting: Radiation Oncology

## 2016-09-30 ENCOUNTER — Other Ambulatory Visit: Payer: Medicaid Other

## 2016-10-07 ENCOUNTER — Ambulatory Visit: Payer: Medicaid Other | Admitting: Internal Medicine

## 2016-10-07 ENCOUNTER — Other Ambulatory Visit: Payer: Medicaid Other

## 2016-10-07 ENCOUNTER — Ambulatory Visit: Payer: Medicaid Other

## 2016-10-22 ENCOUNTER — Telehealth: Payer: Self-pay

## 2016-10-22 NOTE — Telephone Encounter (Signed)
I called him back and summarized radiation history from [place] and [place].

## 2016-10-22 NOTE — Telephone Encounter (Signed)
Thank you, sir. I appreciate you doing this. Sam

## 2016-10-22 NOTE — Telephone Encounter (Signed)
Dr Shelda Jakes in [place] is trying to find out if pt can receive any more tx to T11- L1 area. His office # [phone]. His cell # [phone]

## 2016-12-21 ENCOUNTER — Other Ambulatory Visit: Payer: Medicaid Other

## 2016-12-28 ENCOUNTER — Ambulatory Visit: Payer: Medicaid Other | Admitting: Internal Medicine

## 2019-11-27 ENCOUNTER — Other Ambulatory Visit: Payer: Self-pay | Admitting: Anesthesiology

## 2019-11-27 DIAGNOSIS — M961 Postlaminectomy syndrome, not elsewhere classified: Secondary | ICD-10-CM

## 2019-12-07 ENCOUNTER — Ambulatory Visit
Admission: RE | Admit: 2019-12-07 | Discharge: 2019-12-07 | Disposition: A | Discharge status: Home / Self Care | Payer: Medicaid Other | Source: Ambulatory Visit | Attending: Anesthesiology | Admitting: Anesthesiology

## 2019-12-07 DIAGNOSIS — M961 Postlaminectomy syndrome, not elsewhere classified: Secondary | ICD-10-CM

## 2021-03-25 DIAGNOSIS — Z85118 Personal history of other malignant neoplasm of bronchus and lung: Secondary | ICD-10-CM | POA: Diagnosis not present

## 2021-03-25 DIAGNOSIS — C3491 Malignant neoplasm of unspecified part of right bronchus or lung: Secondary | ICD-10-CM | POA: Diagnosis not present

## 2021-03-25 DIAGNOSIS — Z981 Arthrodesis status: Secondary | ICD-10-CM | POA: Diagnosis not present

## 2021-03-25 DIAGNOSIS — R937 Abnormal findings on diagnostic imaging of other parts of musculoskeletal system: Secondary | ICD-10-CM | POA: Diagnosis not present

## 2021-03-27 DIAGNOSIS — M5440 Lumbago with sciatica, unspecified side: Secondary | ICD-10-CM | POA: Diagnosis not present

## 2021-03-27 DIAGNOSIS — C3491 Malignant neoplasm of unspecified part of right bronchus or lung: Secondary | ICD-10-CM | POA: Diagnosis not present

## 2021-06-16 DIAGNOSIS — M5416 Radiculopathy, lumbar region: Secondary | ICD-10-CM | POA: Diagnosis not present

## 2021-06-16 DIAGNOSIS — M961 Postlaminectomy syndrome, not elsewhere classified: Secondary | ICD-10-CM | POA: Diagnosis not present

## 2021-06-16 DIAGNOSIS — C3490 Malignant neoplasm of unspecified part of unspecified bronchus or lung: Secondary | ICD-10-CM | POA: Diagnosis not present

## 2021-07-15 DIAGNOSIS — Z720 Tobacco use: Secondary | ICD-10-CM | POA: Diagnosis not present

## 2021-07-15 DIAGNOSIS — K5904 Chronic idiopathic constipation: Secondary | ICD-10-CM | POA: Diagnosis not present

## 2021-07-15 DIAGNOSIS — Z2821 Immunization not carried out because of patient refusal: Secondary | ICD-10-CM | POA: Diagnosis not present

## 2021-07-15 DIAGNOSIS — E782 Mixed hyperlipidemia: Secondary | ICD-10-CM | POA: Diagnosis not present

## 2021-07-15 DIAGNOSIS — K219 Gastro-esophageal reflux disease without esophagitis: Secondary | ICD-10-CM | POA: Diagnosis not present

## 2021-07-15 DIAGNOSIS — Z0001 Encounter for general adult medical examination with abnormal findings: Secondary | ICD-10-CM | POA: Diagnosis not present

## 2021-07-15 DIAGNOSIS — R7303 Prediabetes: Secondary | ICD-10-CM | POA: Diagnosis not present

## 2021-07-15 DIAGNOSIS — J449 Chronic obstructive pulmonary disease, unspecified: Secondary | ICD-10-CM | POA: Diagnosis not present

## 2021-08-28 DIAGNOSIS — R7303 Prediabetes: Secondary | ICD-10-CM | POA: Diagnosis not present

## 2021-08-28 DIAGNOSIS — K219 Gastro-esophageal reflux disease without esophagitis: Secondary | ICD-10-CM | POA: Diagnosis not present

## 2021-08-28 DIAGNOSIS — J449 Chronic obstructive pulmonary disease, unspecified: Secondary | ICD-10-CM | POA: Diagnosis not present

## 2021-08-28 DIAGNOSIS — Z720 Tobacco use: Secondary | ICD-10-CM | POA: Diagnosis not present

## 2021-08-28 DIAGNOSIS — G620 Drug-induced polyneuropathy: Secondary | ICD-10-CM | POA: Diagnosis not present

## 2021-08-28 DIAGNOSIS — K5904 Chronic idiopathic constipation: Secondary | ICD-10-CM | POA: Diagnosis not present

## 2021-08-28 DIAGNOSIS — E782 Mixed hyperlipidemia: Secondary | ICD-10-CM | POA: Diagnosis not present

## 2021-08-28 DIAGNOSIS — Z2821 Immunization not carried out because of patient refusal: Secondary | ICD-10-CM | POA: Diagnosis not present

## 2021-09-10 DIAGNOSIS — M5416 Radiculopathy, lumbar region: Secondary | ICD-10-CM | POA: Diagnosis not present

## 2021-09-10 DIAGNOSIS — C3490 Malignant neoplasm of unspecified part of unspecified bronchus or lung: Secondary | ICD-10-CM | POA: Diagnosis not present

## 2021-09-10 DIAGNOSIS — M961 Postlaminectomy syndrome, not elsewhere classified: Secondary | ICD-10-CM | POA: Diagnosis not present

## 2021-09-30 DIAGNOSIS — C3491 Malignant neoplasm of unspecified part of right bronchus or lung: Secondary | ICD-10-CM | POA: Diagnosis not present

## 2021-09-30 DIAGNOSIS — N200 Calculus of kidney: Secondary | ICD-10-CM | POA: Diagnosis not present

## 2021-10-01 DIAGNOSIS — C3491 Malignant neoplasm of unspecified part of right bronchus or lung: Secondary | ICD-10-CM | POA: Diagnosis not present

## 2021-10-01 DIAGNOSIS — M549 Dorsalgia, unspecified: Secondary | ICD-10-CM | POA: Diagnosis not present

## 2021-10-07 DIAGNOSIS — M549 Dorsalgia, unspecified: Secondary | ICD-10-CM | POA: Diagnosis not present

## 2021-10-07 DIAGNOSIS — R29898 Other symptoms and signs involving the musculoskeletal system: Secondary | ICD-10-CM | POA: Diagnosis not present

## 2021-10-07 DIAGNOSIS — G8929 Other chronic pain: Secondary | ICD-10-CM | POA: Diagnosis not present

## 2021-10-07 DIAGNOSIS — Z981 Arthrodesis status: Secondary | ICD-10-CM | POA: Diagnosis not present

## 2021-10-07 DIAGNOSIS — M5441 Lumbago with sciatica, right side: Secondary | ICD-10-CM | POA: Diagnosis not present

## 2021-10-07 DIAGNOSIS — M5442 Lumbago with sciatica, left side: Secondary | ICD-10-CM | POA: Diagnosis not present

## 2021-10-07 DIAGNOSIS — Z9889 Other specified postprocedural states: Secondary | ICD-10-CM | POA: Diagnosis not present

## 2021-10-07 DIAGNOSIS — M40209 Unspecified kyphosis, site unspecified: Secondary | ICD-10-CM | POA: Diagnosis not present

## 2021-11-24 DIAGNOSIS — M5416 Radiculopathy, lumbar region: Secondary | ICD-10-CM | POA: Diagnosis not present

## 2021-11-24 DIAGNOSIS — M961 Postlaminectomy syndrome, not elsewhere classified: Secondary | ICD-10-CM | POA: Diagnosis not present

## 2022-01-20 DIAGNOSIS — Z136 Encounter for screening for cardiovascular disorders: Secondary | ICD-10-CM | POA: Diagnosis not present

## 2022-01-20 DIAGNOSIS — E782 Mixed hyperlipidemia: Secondary | ICD-10-CM | POA: Diagnosis not present

## 2022-01-20 DIAGNOSIS — J449 Chronic obstructive pulmonary disease, unspecified: Secondary | ICD-10-CM | POA: Diagnosis not present

## 2022-01-20 DIAGNOSIS — K219 Gastro-esophageal reflux disease without esophagitis: Secondary | ICD-10-CM | POA: Diagnosis not present

## 2022-01-20 DIAGNOSIS — K5904 Chronic idiopathic constipation: Secondary | ICD-10-CM | POA: Diagnosis not present

## 2022-01-20 DIAGNOSIS — G620 Drug-induced polyneuropathy: Secondary | ICD-10-CM | POA: Diagnosis not present

## 2022-01-20 DIAGNOSIS — I10 Essential (primary) hypertension: Secondary | ICD-10-CM | POA: Diagnosis not present

## 2022-01-20 DIAGNOSIS — R7303 Prediabetes: Secondary | ICD-10-CM | POA: Diagnosis not present

## 2022-01-20 DIAGNOSIS — Z0001 Encounter for general adult medical examination with abnormal findings: Secondary | ICD-10-CM | POA: Diagnosis not present

## 2022-01-20 DIAGNOSIS — Z720 Tobacco use: Secondary | ICD-10-CM | POA: Diagnosis not present

## 2022-02-09 DIAGNOSIS — M961 Postlaminectomy syndrome, not elsewhere classified: Secondary | ICD-10-CM | POA: Diagnosis not present

## 2022-02-09 DIAGNOSIS — M5416 Radiculopathy, lumbar region: Secondary | ICD-10-CM | POA: Diagnosis not present

## 2022-03-25 DIAGNOSIS — K219 Gastro-esophageal reflux disease without esophagitis: Secondary | ICD-10-CM | POA: Diagnosis not present

## 2022-03-25 DIAGNOSIS — K5904 Chronic idiopathic constipation: Secondary | ICD-10-CM | POA: Diagnosis not present

## 2022-03-25 DIAGNOSIS — E782 Mixed hyperlipidemia: Secondary | ICD-10-CM | POA: Diagnosis not present

## 2022-03-25 DIAGNOSIS — G620 Drug-induced polyneuropathy: Secondary | ICD-10-CM | POA: Diagnosis not present

## 2022-03-25 DIAGNOSIS — J449 Chronic obstructive pulmonary disease, unspecified: Secondary | ICD-10-CM | POA: Diagnosis not present

## 2022-03-25 DIAGNOSIS — Z720 Tobacco use: Secondary | ICD-10-CM | POA: Diagnosis not present

## 2022-03-25 DIAGNOSIS — R7303 Prediabetes: Secondary | ICD-10-CM | POA: Diagnosis not present

## 2022-03-30 DIAGNOSIS — N281 Cyst of kidney, acquired: Secondary | ICD-10-CM | POA: Diagnosis not present

## 2022-03-30 DIAGNOSIS — J438 Other emphysema: Secondary | ICD-10-CM | POA: Diagnosis not present

## 2022-03-30 DIAGNOSIS — C3491 Malignant neoplasm of unspecified part of right bronchus or lung: Secondary | ICD-10-CM | POA: Diagnosis not present

## 2022-04-01 DIAGNOSIS — C3491 Malignant neoplasm of unspecified part of right bronchus or lung: Secondary | ICD-10-CM | POA: Diagnosis not present

## 2022-04-21 DIAGNOSIS — M961 Postlaminectomy syndrome, not elsewhere classified: Secondary | ICD-10-CM | POA: Diagnosis not present

## 2022-04-21 DIAGNOSIS — M5416 Radiculopathy, lumbar region: Secondary | ICD-10-CM | POA: Diagnosis not present

## 2022-07-13 DIAGNOSIS — M961 Postlaminectomy syndrome, not elsewhere classified: Secondary | ICD-10-CM | POA: Diagnosis not present

## 2022-07-13 DIAGNOSIS — C3490 Malignant neoplasm of unspecified part of unspecified bronchus or lung: Secondary | ICD-10-CM | POA: Diagnosis not present

## 2022-07-13 DIAGNOSIS — R7303 Prediabetes: Secondary | ICD-10-CM | POA: Diagnosis not present

## 2022-07-13 DIAGNOSIS — E782 Mixed hyperlipidemia: Secondary | ICD-10-CM | POA: Diagnosis not present

## 2022-07-13 DIAGNOSIS — J449 Chronic obstructive pulmonary disease, unspecified: Secondary | ICD-10-CM | POA: Diagnosis not present

## 2022-07-13 DIAGNOSIS — G620 Drug-induced polyneuropathy: Secondary | ICD-10-CM | POA: Diagnosis not present

## 2022-07-13 DIAGNOSIS — J209 Acute bronchitis, unspecified: Secondary | ICD-10-CM | POA: Diagnosis not present

## 2022-07-13 DIAGNOSIS — M5416 Radiculopathy, lumbar region: Secondary | ICD-10-CM | POA: Diagnosis not present

## 2022-07-13 DIAGNOSIS — K219 Gastro-esophageal reflux disease without esophagitis: Secondary | ICD-10-CM | POA: Diagnosis not present

## 2022-10-22 DIAGNOSIS — K820 Obstruction of gallbladder: Secondary | ICD-10-CM | POA: Diagnosis not present

## 2022-10-22 DIAGNOSIS — C3491 Malignant neoplasm of unspecified part of right bronchus or lung: Secondary | ICD-10-CM | POA: Diagnosis not present

## 2022-10-22 DIAGNOSIS — Z85118 Personal history of other malignant neoplasm of bronchus and lung: Secondary | ICD-10-CM | POA: Diagnosis not present

## 2022-10-28 DIAGNOSIS — C3491 Malignant neoplasm of unspecified part of right bronchus or lung: Secondary | ICD-10-CM | POA: Diagnosis not present

## 2022-10-28 DIAGNOSIS — C7951 Secondary malignant neoplasm of bone: Secondary | ICD-10-CM | POA: Diagnosis not present

## 2022-11-03 ENCOUNTER — Telehealth: Payer: Self-pay

## 2022-11-03 NOTE — Patient Outreach (Signed)
  Care Coordination   11/03/2022 Name: Melchizedek Espinola MRN: [id number] DOB: 03-19-1956   Care Coordination Outreach Attempts:  An unsuccessful telephone outreach was attempted today to offer the patient information about available care coordination services as a benefit of their health plan.   Follow Up Plan:  Additional outreach attempts will be made to offer the patient care coordination information and services.   Encounter Outcome:  No Answer   Care Coordination Interventions:  No, not indicated    Thea Silversmith, RN, MSN, BSN, [place] Coordinator [phone]

## 2022-11-10 ENCOUNTER — Telehealth: Payer: Self-pay

## 2022-11-10 NOTE — Patient Outreach (Signed)
  Care Coordination   11/10/2022 Name: Herbert Smith MRN: [id number] DOB: [birth year]   Care Coordination Outreach Attempts:  A second unsuccessful outreach was attempted today to offer the patient with information about available care coordination services as a benefit of their health plan.     Follow Up Plan:  Additional outreach attempts will be made to offer the patient care coordination information and services.   Encounter Outcome:  No Answer   Care Coordination Interventions:  No, not indicated    Thea Silversmith, RN, MSN, BSN, [place] Coordinator [phone]

## 2022-11-24 ENCOUNTER — Telehealth: Payer: Self-pay

## 2022-11-24 NOTE — Patient Outreach (Signed)
  Care Coordination   11/24/2022 Name: Herbert Smith MRN: [id number] DOB: [birth year]   Care Coordination Outreach Attempts:  A third unsuccessful outreach was attempted today to offer the patient with information about available care coordination services as a benefit of their health plan.   Follow Up Plan:  No further outreach attempts will be made at this time. We have been unable to contact the patient to offer or enroll patient in care coordination services  Encounter Outcome:  No Answer   Care Coordination Interventions:  No, not indicated    Thea Silversmith, RN, MSN, BSN, [place] Coordinator [phone]

## 2022-12-22 DIAGNOSIS — C3490 Malignant neoplasm of unspecified part of unspecified bronchus or lung: Secondary | ICD-10-CM | POA: Diagnosis not present

## 2022-12-22 DIAGNOSIS — M961 Postlaminectomy syndrome, not elsewhere classified: Secondary | ICD-10-CM | POA: Diagnosis not present

## 2023-03-23 ENCOUNTER — Other Ambulatory Visit: Payer: Self-pay | Admitting: Neurosurgery

## 2023-03-23 DIAGNOSIS — K5904 Chronic idiopathic constipation: Secondary | ICD-10-CM | POA: Diagnosis not present

## 2023-03-23 DIAGNOSIS — M5416 Radiculopathy, lumbar region: Secondary | ICD-10-CM

## 2023-03-23 DIAGNOSIS — M961 Postlaminectomy syndrome, not elsewhere classified: Secondary | ICD-10-CM | POA: Diagnosis not present

## 2023-03-23 DIAGNOSIS — Z0001 Encounter for general adult medical examination with abnormal findings: Secondary | ICD-10-CM | POA: Diagnosis not present

## 2023-03-23 DIAGNOSIS — K219 Gastro-esophageal reflux disease without esophagitis: Secondary | ICD-10-CM | POA: Diagnosis not present

## 2023-03-23 DIAGNOSIS — G620 Drug-induced polyneuropathy: Secondary | ICD-10-CM | POA: Diagnosis not present

## 2023-03-23 DIAGNOSIS — Z720 Tobacco use: Secondary | ICD-10-CM | POA: Diagnosis not present

## 2023-03-23 DIAGNOSIS — J449 Chronic obstructive pulmonary disease, unspecified: Secondary | ICD-10-CM | POA: Diagnosis not present

## 2023-03-23 DIAGNOSIS — E782 Mixed hyperlipidemia: Secondary | ICD-10-CM | POA: Diagnosis not present

## 2023-03-23 DIAGNOSIS — C3490 Malignant neoplasm of unspecified part of unspecified bronchus or lung: Secondary | ICD-10-CM | POA: Diagnosis not present

## 2023-03-23 DIAGNOSIS — R7303 Prediabetes: Secondary | ICD-10-CM | POA: Diagnosis not present

## 2023-04-06 DIAGNOSIS — K5904 Chronic idiopathic constipation: Secondary | ICD-10-CM | POA: Diagnosis not present

## 2023-04-06 DIAGNOSIS — G620 Drug-induced polyneuropathy: Secondary | ICD-10-CM | POA: Diagnosis not present

## 2023-04-06 DIAGNOSIS — E782 Mixed hyperlipidemia: Secondary | ICD-10-CM | POA: Diagnosis not present

## 2023-04-06 DIAGNOSIS — R7303 Prediabetes: Secondary | ICD-10-CM | POA: Diagnosis not present

## 2023-04-06 DIAGNOSIS — K219 Gastro-esophageal reflux disease without esophagitis: Secondary | ICD-10-CM | POA: Diagnosis not present

## 2023-04-06 DIAGNOSIS — J449 Chronic obstructive pulmonary disease, unspecified: Secondary | ICD-10-CM | POA: Diagnosis not present

## 2023-04-06 DIAGNOSIS — Z720 Tobacco use: Secondary | ICD-10-CM | POA: Diagnosis not present

## 2023-04-06 DIAGNOSIS — Z0001 Encounter for general adult medical examination with abnormal findings: Secondary | ICD-10-CM | POA: Diagnosis not present

## 2023-04-19 ENCOUNTER — Ambulatory Visit
Admission: RE | Admit: 2023-04-19 | Discharge: 2023-04-19 | Disposition: A | Discharge status: Home / Self Care | Payer: 59 | Source: Ambulatory Visit | Attending: Neurosurgery | Admitting: Neurosurgery

## 2023-04-19 DIAGNOSIS — M5416 Radiculopathy, lumbar region: Secondary | ICD-10-CM

## 2023-04-19 DIAGNOSIS — M47816 Spondylosis without myelopathy or radiculopathy, lumbar region: Secondary | ICD-10-CM | POA: Diagnosis not present

## 2023-04-19 DIAGNOSIS — M546 Pain in thoracic spine: Secondary | ICD-10-CM | POA: Diagnosis not present

## 2023-06-10 DIAGNOSIS — M961 Postlaminectomy syndrome, not elsewhere classified: Secondary | ICD-10-CM | POA: Diagnosis not present

## 2023-06-10 DIAGNOSIS — C3490 Malignant neoplasm of unspecified part of unspecified bronchus or lung: Secondary | ICD-10-CM | POA: Diagnosis not present

## 2023-06-10 DIAGNOSIS — M5416 Radiculopathy, lumbar region: Secondary | ICD-10-CM | POA: Diagnosis not present

## 2023-07-05 DIAGNOSIS — C3491 Malignant neoplasm of unspecified part of right bronchus or lung: Secondary | ICD-10-CM | POA: Diagnosis not present

## 2023-07-10 ENCOUNTER — Emergency Department (HOSPITAL_COMMUNITY): Payer: 59

## 2023-07-10 ENCOUNTER — Emergency Department (HOSPITAL_COMMUNITY)
Admission: EM | Admit: 2023-07-10 | Discharge: 2023-07-11 | Discharge status: Against Medical Advice | Payer: 59 | Attending: Medical | Admitting: Medical

## 2023-07-10 ENCOUNTER — Encounter (HOSPITAL_COMMUNITY): Payer: Self-pay | Admitting: Emergency Medicine

## 2023-07-10 DIAGNOSIS — M542 Cervicalgia: Secondary | ICD-10-CM | POA: Insufficient documentation

## 2023-07-10 DIAGNOSIS — Z85118 Personal history of other malignant neoplasm of bronchus and lung: Secondary | ICD-10-CM | POA: Diagnosis not present

## 2023-07-10 DIAGNOSIS — Z5321 Procedure and treatment not carried out due to patient leaving prior to being seen by health care provider: Secondary | ICD-10-CM | POA: Diagnosis not present

## 2023-07-10 DIAGNOSIS — R0789 Other chest pain: Secondary | ICD-10-CM | POA: Diagnosis not present

## 2023-07-10 DIAGNOSIS — J439 Emphysema, unspecified: Secondary | ICD-10-CM | POA: Diagnosis not present

## 2023-07-10 DIAGNOSIS — R079 Chest pain, unspecified: Secondary | ICD-10-CM | POA: Diagnosis not present

## 2023-07-10 DIAGNOSIS — M545 Low back pain, unspecified: Secondary | ICD-10-CM | POA: Diagnosis not present

## 2023-07-10 LAB — CBC WITH DIFFERENTIAL/PLATELET
Abs Immature Granulocytes: 0.01 10*3/uL (ref 0.00–0.07)
Basophils Absolute: 0 10*3/uL (ref 0.0–0.1)
Basophils Relative: 1 %
Eosinophils Absolute: 0.1 10*3/uL (ref 0.0–0.5)
Eosinophils Relative: 3 %
HCT: 44.6 % (ref 39.0–52.0)
Hemoglobin: 14.2 g/dL (ref 13.0–17.0)
Immature Granulocytes: 0 %
Lymphocytes Relative: 42 %
Lymphs Abs: 2.2 10*3/uL (ref 0.7–4.0)
MCH: 28.1 pg (ref 26.0–34.0)
MCHC: 31.8 g/dL (ref 30.0–36.0)
MCV: 88.3 fL (ref 80.0–100.0)
Monocytes Absolute: 0.4 10*3/uL (ref 0.1–1.0)
Monocytes Relative: 7 %
Neutro Abs: 2.5 10*3/uL (ref 1.7–7.7)
Neutrophils Relative %: 47 %
Platelets: 222 10*3/uL (ref 150–400)
RBC: 5.05 MIL/uL (ref 4.22–5.81)
RDW: 15.9 % — ABNORMAL HIGH (ref 11.5–15.5)
WBC: 5.2 10*3/uL (ref 4.0–10.5)
nRBC: 0 % (ref 0.0–0.2)

## 2023-07-10 LAB — COMPREHENSIVE METABOLIC PANEL
ALT: 20 U/L (ref 0–44)
AST: 25 U/L (ref 15–41)
Albumin: 3.8 g/dL (ref 3.5–5.0)
Alkaline Phosphatase: 57 U/L (ref 38–126)
Anion gap: 10 (ref 5–15)
BUN: 25 mg/dL — ABNORMAL HIGH (ref 8–23)
CO2: 25 mmol/L (ref 22–32)
Calcium: 10.5 mg/dL — ABNORMAL HIGH (ref 8.9–10.3)
Chloride: 103 mmol/L (ref 98–111)
Creatinine, Ser: 0.81 mg/dL (ref 0.61–1.24)
GFR, Estimated: >60 mL/min (ref >60)
Glucose, Bld: 107 mg/dL — ABNORMAL HIGH (ref 70–99)
Potassium: 4.1 mmol/L (ref 3.5–5.1)
Sodium: 138 mmol/L (ref 135–145)
Total Bilirubin: 0.5 mg/dL (ref 0.3–1.2)
Total Protein: 7.7 g/dL (ref 6.5–8.1)

## 2023-07-10 LAB — TROPONIN I (HIGH SENSITIVITY)
Troponin I (High Sensitivity): 5 ng/L (ref <18)
Troponin I (High Sensitivity): 6 ng/L (ref <18)

## 2023-07-10 MED ORDER — OXYCODONE HCL 5 MG PO TABS
10.0000 mg | ORAL_TABLET | Freq: Once | ORAL | Status: AC
  Administered 2023-07-10: 10 mg via ORAL
  Filled 2023-07-10: qty 2

## 2023-07-10 NOTE — ED Provider Triage Note (Signed)
Emergency Medicine Provider Triage Evaluation Note  Herbert Smith , a 67 y.o. male  was evaluated in triage.  Pt complains of neck pain, rating to his chest, and shoulders, that is been going on for the last day.  He states he has chronic back problems, and the pain clinic, will not fill his meds, thus he is here.  Takes oxycodone, and hydromorphone.  Review of Systems  Positive: Chest pain, back pain Negative: feer  Physical Exam  BP 91/78   Pulse 78   Temp (!) 97.5 F (36.4 C)   Resp 19   SpO2 97%  Gen:   Awake, no distress   Resp:  Normal effort  MSK:   Moves extremities without difficulty  Other:  Ttp of chest wall, back  Medical Decision Making  Medically screening exam initiated at 6:05 PM.  Appropriate orders placed.  Herbert Smith was informed that the remainder of the evaluation will be completed by another provider, this initial triage assessment does not replace that evaluation, and the importance of remaining in the ED until their evaluation is complete   Pete Pelt, [place] 07/10/23 1808

## 2023-07-10 NOTE — ED Notes (Signed)
Pt left d/t wait time

## 2023-07-10 NOTE — ED Triage Notes (Signed)
Pt here from home with c/o back pain that radiates through his body, pt use to go to the pain clinic but is unable to get his meds there now

## 2023-07-19 ENCOUNTER — Encounter (HOSPITAL_BASED_OUTPATIENT_CLINIC_OR_DEPARTMENT_OTHER): Payer: Self-pay

## 2023-07-19 ENCOUNTER — Emergency Department (HOSPITAL_BASED_OUTPATIENT_CLINIC_OR_DEPARTMENT_OTHER)
Admission: EM | Admit: 2023-07-19 | Discharge: 2023-07-19 | Disposition: A | Discharge status: Home / Self Care | Payer: 59 | Attending: Emergency Medicine | Admitting: Emergency Medicine

## 2023-07-19 ENCOUNTER — Other Ambulatory Visit: Payer: Self-pay

## 2023-07-19 DIAGNOSIS — M51362 Other intervertebral disc degeneration, lumbar region with discogenic back pain and lower extremity pain: Secondary | ICD-10-CM | POA: Diagnosis not present

## 2023-07-19 DIAGNOSIS — G8929 Other chronic pain: Secondary | ICD-10-CM | POA: Insufficient documentation

## 2023-07-19 DIAGNOSIS — K5904 Chronic idiopathic constipation: Secondary | ICD-10-CM | POA: Diagnosis not present

## 2023-07-19 DIAGNOSIS — M5459 Other low back pain: Secondary | ICD-10-CM | POA: Diagnosis not present

## 2023-07-19 DIAGNOSIS — Z720 Tobacco use: Secondary | ICD-10-CM | POA: Diagnosis not present

## 2023-07-19 DIAGNOSIS — J449 Chronic obstructive pulmonary disease, unspecified: Secondary | ICD-10-CM | POA: Diagnosis not present

## 2023-07-19 DIAGNOSIS — M549 Dorsalgia, unspecified: Secondary | ICD-10-CM | POA: Insufficient documentation

## 2023-07-19 DIAGNOSIS — R7303 Prediabetes: Secondary | ICD-10-CM | POA: Diagnosis not present

## 2023-07-19 DIAGNOSIS — G620 Drug-induced polyneuropathy: Secondary | ICD-10-CM | POA: Diagnosis not present

## 2023-07-19 DIAGNOSIS — E782 Mixed hyperlipidemia: Secondary | ICD-10-CM | POA: Diagnosis not present

## 2023-07-19 DIAGNOSIS — K219 Gastro-esophageal reflux disease without esophagitis: Secondary | ICD-10-CM | POA: Diagnosis not present

## 2023-07-19 MED ORDER — OXYCODONE HCL 5 MG PO TABS
5.0000 mg | ORAL_TABLET | Freq: Once | ORAL | Status: AC
  Administered 2023-07-19: 5 mg via ORAL
  Filled 2023-07-19: qty 1

## 2023-07-19 MED ORDER — OXYCODONE-ACETAMINOPHEN 5-325 MG PO TABS: 1.0000 | ORAL_TABLET | Freq: Four times a day (QID) | ORAL | 0 refills | Status: AC | PRN

## 2023-07-19 NOTE — ED Notes (Signed)
Discharge instructions reviewed with patient. Patient questions answered and opportunity for education reviewed. Patient voices understanding of discharge instructions with no further questions. Patient ambulatory with steady gait to lobby.  

## 2023-07-19 NOTE — ED Triage Notes (Signed)
Pt states he is having back pain and has been out of meds x 1 month.  Was a pt at the pain clinic and can not get a appt.   Had imaging done recently and states he has a fracture in his L1.  Since then he can not get anyone to speak with him

## 2023-07-19 NOTE — Discharge Instructions (Signed)
Like we discussed, we can send you with a couple of days of pain medication, but you need to follow-up with your primary care doctor for management of your chronic pain.  Only return to the emergency department for your back pain if you develop inability to walk, fever, new or different back pain, numbness or weakness in your legs, or or difficulty with your bladder.

## 2023-07-19 NOTE — ED Provider Notes (Signed)
[place] EMERGENCY DEPARTMENT AT [facility] Provider Note   CSN: [id number] Arrival date & time: 07/19/23  1508     History  Chief Complaint  Patient presents with   Back Pain    Herbert Smith is a 67 y.o. male.  This is a 67 year old male is coming to the emergency department today due to chronic back pain.  Patient has a history of metastatic lung cancer currently in remission, fortunately has multiple compression fractures.  Has followed with spine surgery.  Patient has not been following with pain clinic for the last 1 month.  He called his primary care office today recommended he come to the emergency department.   Back Pain      Home Medications Prior to Admission medications   Medication Sig Start Date End Date Taking? Authorizing Provider  oxyCODONE-acetaminophen (PERCOCET/ROXICET) 5-325 MG tablet Take 1 tablet by mouth every 6 (six) hours as needed for up to 3 days for severe pain (pain score 7-10). 07/19/23 07/22/23 Yes Anders Simmonds T, DO  acetaminophen (TYLENOL) 325 MG tablet Take 2 tablets (650 mg total) by mouth every 6 (six) hours as needed for mild pain (or Fever >/= 101). 06/05/16   Kirby Crigler, Mir M, MD  albuterol (PROVENTIL HFA;VENTOLIN HFA) 108 (90 BASE) MCG/ACT inhaler Inhale 2 puffs into the lungs every 6 (six) hours as needed for wheezing or shortness of breath.    [provider]  albuterol (PROVENTIL) (2.5 MG/3ML) 0.083% nebulizer solution Take 2.5 mg by nebulization every 6 (six) hours as needed for wheezing or shortness of breath.    [provider]  amoxicillin-clavulanate (AUGMENTIN) 875-125 MG tablet TK 1 T PO Q 12 H FOR 7 DAYS disp: 14 No refills. 08/12/16   Margaretmary Dys, MD  bisacodyl (DULCOLAX) 5 MG EC tablet Take 1 tablet (5 mg total) by mouth daily as needed for moderate constipation. 06/05/16   Kirby Crigler, Mir M, MD  castor oil liquid Take 5 mLs by mouth daily as needed for moderate constipation.    [provider]  cyclobenzaprine (FLEXERIL) 5 MG tablet Take 1 tablet (5 mg total) by mouth 3 (three) times daily as needed for muscle spasms. 06/05/16   Kirby Crigler, Mir M, MD  dexamethasone (DECADRON) 4 MG tablet Take 0.5 tablets (2 mg total) by mouth 2 (two) times daily. 07/16/16   Ronny Bacon, PA-C  diphenhydrAMINE (BENADRYL) 25 MG tablet Take 50 mg by mouth at bedtime as needed for sleep.    [provider]  feeding supplement, ENSURE ENLIVE, (ENSURE ENLIVE) LIQD Take 237 mLs by mouth 3 (three) times daily between meals. Patient not taking: Reported on 08/12/2016 06/05/16   Kirby Crigler, Mir M, MD  folic acid (FOLVITE) 1 MG tablet Take 1 tablet (1 mg total) by mouth daily. 08/24/16   Si Gaul, MD  gabapentin (NEURONTIN) 300 MG capsule Take 1 capsule (300 mg total) by mouth 3 (three) times daily as needed. 07/16/16   Ronny Bacon, PA-C  oxyCODONE (OXYCONTIN) 30 MG 12 hr tablet Take 30 mg by mouth 2 (two) times daily. 09/09/16   Ronny Bacon, PA-C  Oxycodone HCl 10 MG TABS Take 1-2 tablets (10-20 mg total) by mouth every 4 (four) hours as needed. Severe or breakthrough pain 09/09/16   Ronny Bacon, PA-C  pantoprazole (PROTONIX) 40 MG tablet Take 1 tablet (40 mg total) by mouth daily. 06/05/16   Kirby Crigler, Mir M, MD  prochlorperazine (COMPAZINE) 10 MG tablet Take 1 tablet (10 mg total)  by mouth every 6 (six) hours as needed for nausea or vomiting. 08/24/16   Si Gaul, MD  senna-docusate (SENOKOT-S) 8.6-50 MG tablet Take 2 tablets by mouth 2 (two) times daily. 06/05/16   Kirby Crigler, Mir M, MD  tiotropium (SPIRIVA) 18 MCG inhalation capsule Place 18 mcg into inhaler and inhale 2 (two) times daily.    [provider]      Allergies    Fentanyl and Morphine    Review of Systems   Review of Systems  Musculoskeletal:  Positive for back pain.    Physical Exam Updated Vital Signs BP 103/60 (BP Location: Left Arm)   Pulse 67    Temp (!) 97 F (36.1 C)   Resp 20   Ht 6\' 8"  (2.032 m)   Wt 86.6 kg   SpO2 99%   BMI 20.98 kg/m  Physical Exam Vitals reviewed.  Skin:    General: Skin is warm and dry.  Neurological:     General: No focal deficit present.     Mental Status: He is oriented to person, place, and time.     Sensory: No sensory deficit.     Motor: No weakness.     Deep Tendon Reflexes: Reflexes normal.     Comments: Patient uses a walker     ED Results / Procedures / Treatments   Labs (all labs ordered are listed, but only abnormal results are displayed) Labs Reviewed - No data to display  EKG None  Radiology No results found.  Procedures Procedures    Medications Ordered in ED Medications  oxyCODONE (Oxy IR/ROXICODONE) immediate release tablet 5 mg (has no administration in time range)    ED Course/ Medical Decision Making/ A&P                                 Medical Decision Making 67 year old male here today for chronic back pain.  Plan-patient clearly does have reasons for having this back pain.  He has no red flag symptoms of back pain, no new deficits.  He is having continuation of his previous pain.  Reading through the patient's chart, he has followed with neurosurgery and spine pain clinics.  Unfortunately, he does not have any additional surgical options presented to him.  Patient was referred here by his primary care office for management of this acute pain flare, which will be able to treat with oral medications.  I told the patient that we would send him with a couple of days of pain medication, but he needed to call his primary care office for long-term prescription management.  Patient understood and was agreeable with this.  I considered imaging on this patient, however he has no changes in his chronic back pain, and had CT imaging done within the last month.           Final Clinical Impression(s) / ED Diagnoses Final diagnoses:  Chronic back pain,  unspecified back location, unspecified back pain laterality    Rx / DC Orders ED Discharge Orders          Ordered    oxyCODONE-acetaminophen (PERCOCET/ROXICET) 5-325 MG tablet  Every 6 hours PRN        07/19/23 1830              Anders Simmonds T, DO 07/19/23 1831

## 2023-07-27 DIAGNOSIS — M4726 Other spondylosis with radiculopathy, lumbar region: Secondary | ICD-10-CM | POA: Diagnosis not present

## 2023-07-28 ENCOUNTER — Other Ambulatory Visit: Payer: Self-pay | Admitting: Physician Assistant

## 2023-07-28 DIAGNOSIS — M5135 Other intervertebral disc degeneration, thoracolumbar region: Secondary | ICD-10-CM

## 2023-07-28 DIAGNOSIS — M4726 Other spondylosis with radiculopathy, lumbar region: Secondary | ICD-10-CM

## 2023-07-28 DIAGNOSIS — C801 Malignant (primary) neoplasm, unspecified: Secondary | ICD-10-CM

## 2023-07-28 DIAGNOSIS — C799 Secondary malignant neoplasm of unspecified site: Secondary | ICD-10-CM

## 2023-09-06 DIAGNOSIS — K5904 Chronic idiopathic constipation: Secondary | ICD-10-CM | POA: Diagnosis not present

## 2023-09-06 DIAGNOSIS — M51362 Other intervertebral disc degeneration, lumbar region with discogenic back pain and lower extremity pain: Secondary | ICD-10-CM | POA: Diagnosis not present

## 2023-09-06 DIAGNOSIS — Z720 Tobacco use: Secondary | ICD-10-CM | POA: Diagnosis not present

## 2023-09-06 DIAGNOSIS — J449 Chronic obstructive pulmonary disease, unspecified: Secondary | ICD-10-CM | POA: Diagnosis not present

## 2023-09-06 DIAGNOSIS — K219 Gastro-esophageal reflux disease without esophagitis: Secondary | ICD-10-CM | POA: Diagnosis not present

## 2023-09-06 DIAGNOSIS — G620 Drug-induced polyneuropathy: Secondary | ICD-10-CM | POA: Diagnosis not present

## 2023-09-06 DIAGNOSIS — R7303 Prediabetes: Secondary | ICD-10-CM | POA: Diagnosis not present

## 2023-09-06 DIAGNOSIS — E782 Mixed hyperlipidemia: Secondary | ICD-10-CM | POA: Diagnosis not present

## 2023-09-06 DIAGNOSIS — R9389 Abnormal findings on diagnostic imaging of other specified body structures: Secondary | ICD-10-CM | POA: Diagnosis not present

## 2023-09-08 ENCOUNTER — Ambulatory Visit: Payer: 59

## 2023-09-17 ENCOUNTER — Other Ambulatory Visit: Payer: 59

## 2023-09-17 ENCOUNTER — Inpatient Hospital Stay: Admission: RE | Admit: 2023-09-17 | Payer: 59 | Source: Ambulatory Visit

## 2023-09-23 ENCOUNTER — Ambulatory Visit: Payer: 59 | Attending: Internal Medicine

## 2023-10-06 DIAGNOSIS — G894 Chronic pain syndrome: Secondary | ICD-10-CM | POA: Diagnosis not present

## 2023-10-06 DIAGNOSIS — M79669 Pain in unspecified lower leg: Secondary | ICD-10-CM | POA: Diagnosis not present

## 2023-10-06 DIAGNOSIS — Z79891 Long term (current) use of opiate analgesic: Secondary | ICD-10-CM | POA: Diagnosis not present

## 2023-10-06 DIAGNOSIS — M25562 Pain in left knee: Secondary | ICD-10-CM | POA: Diagnosis not present

## 2023-10-06 DIAGNOSIS — M25511 Pain in right shoulder: Secondary | ICD-10-CM | POA: Diagnosis not present

## 2023-11-03 ENCOUNTER — Other Ambulatory Visit: Payer: Medicaid Other

## 2023-11-03 DIAGNOSIS — Z79891 Long term (current) use of opiate analgesic: Secondary | ICD-10-CM | POA: Diagnosis not present

## 2023-11-03 DIAGNOSIS — M25562 Pain in left knee: Secondary | ICD-10-CM | POA: Diagnosis not present

## 2023-11-03 DIAGNOSIS — G894 Chronic pain syndrome: Secondary | ICD-10-CM | POA: Diagnosis not present

## 2023-11-03 DIAGNOSIS — M25511 Pain in right shoulder: Secondary | ICD-10-CM | POA: Diagnosis not present

## 2023-11-03 DIAGNOSIS — M25561 Pain in right knee: Secondary | ICD-10-CM | POA: Diagnosis not present

## 2023-11-12 DIAGNOSIS — M25511 Pain in right shoulder: Secondary | ICD-10-CM | POA: Diagnosis not present

## 2023-11-12 DIAGNOSIS — M25562 Pain in left knee: Secondary | ICD-10-CM | POA: Diagnosis not present

## 2023-11-12 DIAGNOSIS — G894 Chronic pain syndrome: Secondary | ICD-10-CM | POA: Diagnosis not present

## 2023-11-12 DIAGNOSIS — Z79891 Long term (current) use of opiate analgesic: Secondary | ICD-10-CM | POA: Diagnosis not present

## 2023-11-12 DIAGNOSIS — M25561 Pain in right knee: Secondary | ICD-10-CM | POA: Diagnosis not present

## 2023-12-01 DIAGNOSIS — G894 Chronic pain syndrome: Secondary | ICD-10-CM | POA: Diagnosis not present

## 2023-12-01 DIAGNOSIS — Z79891 Long term (current) use of opiate analgesic: Secondary | ICD-10-CM | POA: Diagnosis not present

## 2023-12-01 DIAGNOSIS — M25562 Pain in left knee: Secondary | ICD-10-CM | POA: Diagnosis not present

## 2023-12-01 DIAGNOSIS — M25511 Pain in right shoulder: Secondary | ICD-10-CM | POA: Diagnosis not present

## 2023-12-01 DIAGNOSIS — M79669 Pain in unspecified lower leg: Secondary | ICD-10-CM | POA: Diagnosis not present

## 2024-02-02 DIAGNOSIS — Z720 Tobacco use: Secondary | ICD-10-CM | POA: Diagnosis not present

## 2024-02-02 DIAGNOSIS — G620 Drug-induced polyneuropathy: Secondary | ICD-10-CM | POA: Diagnosis not present

## 2024-02-02 DIAGNOSIS — M51362 Other intervertebral disc degeneration, lumbar region with discogenic back pain and lower extremity pain: Secondary | ICD-10-CM | POA: Diagnosis not present

## 2024-02-02 DIAGNOSIS — K219 Gastro-esophageal reflux disease without esophagitis: Secondary | ICD-10-CM | POA: Diagnosis not present

## 2024-02-02 DIAGNOSIS — G894 Chronic pain syndrome: Secondary | ICD-10-CM | POA: Diagnosis not present

## 2024-02-02 DIAGNOSIS — E782 Mixed hyperlipidemia: Secondary | ICD-10-CM | POA: Diagnosis not present

## 2024-02-02 DIAGNOSIS — J449 Chronic obstructive pulmonary disease, unspecified: Secondary | ICD-10-CM | POA: Diagnosis not present

## 2024-02-02 DIAGNOSIS — R9389 Abnormal findings on diagnostic imaging of other specified body structures: Secondary | ICD-10-CM | POA: Diagnosis not present

## 2024-02-02 DIAGNOSIS — R627 Adult failure to thrive: Secondary | ICD-10-CM | POA: Diagnosis not present

## 2024-02-02 DIAGNOSIS — Z0001 Encounter for general adult medical examination with abnormal findings: Secondary | ICD-10-CM | POA: Diagnosis not present

## 2024-02-02 DIAGNOSIS — R7303 Prediabetes: Secondary | ICD-10-CM | POA: Diagnosis not present

## 2024-02-02 DIAGNOSIS — K5904 Chronic idiopathic constipation: Secondary | ICD-10-CM | POA: Diagnosis not present
